# Patient Record
Sex: Female | Born: 1955
Health system: Southern US, Community
[De-identification: ages and names within clinical notes are randomized; demographics above are authoritative.]

## PROBLEM LIST (undated history)

## (undated) DIAGNOSIS — I10 Essential (primary) hypertension: Secondary | ICD-10-CM

## (undated) DIAGNOSIS — E785 Hyperlipidemia, unspecified: Secondary | ICD-10-CM

## (undated) DIAGNOSIS — K219 Gastro-esophageal reflux disease without esophagitis: Secondary | ICD-10-CM

## (undated) HISTORY — PX: ECTOPIC PREGNANCY SURGERY: SHX613

## (undated) HISTORY — DX: Gastro-esophageal reflux disease without esophagitis: K21.9

## (undated) HISTORY — PX: ABDOMINAL HYSTERECTOMY: SHX81

## (undated) HISTORY — DX: Essential (primary) hypertension: I10

## (undated) HISTORY — DX: Hyperlipidemia, unspecified: E78.5

---

## 2014-05-08 ENCOUNTER — Ambulatory Visit (INDEPENDENT_AMBULATORY_CARE_PROVIDER_SITE_OTHER): Payer: 59 | Admitting: Family Medicine

## 2014-05-08 VITALS — BP 106/76 | HR 60 | Temp 97.2°F | Resp 18 | Ht 62.0 in | Wt 146.2 lb

## 2014-05-08 DIAGNOSIS — I10 Essential (primary) hypertension: Secondary | ICD-10-CM | POA: Insufficient documentation

## 2014-05-08 DIAGNOSIS — N760 Acute vaginitis: Secondary | ICD-10-CM

## 2014-05-08 DIAGNOSIS — B9689 Other specified bacterial agents as the cause of diseases classified elsewhere: Secondary | ICD-10-CM

## 2014-05-08 DIAGNOSIS — A499 Bacterial infection, unspecified: Secondary | ICD-10-CM

## 2014-05-08 DIAGNOSIS — E785 Hyperlipidemia, unspecified: Secondary | ICD-10-CM

## 2014-05-08 DIAGNOSIS — N898 Other specified noninflammatory disorders of vagina: Secondary | ICD-10-CM

## 2014-05-08 LAB — POCT WET PREP WITH KOH
KOH PREP POC: NEGATIVE
RBC WET PREP PER HPF POC: NEGATIVE
Trichomonas, UA: NEGATIVE
YEAST WET PREP PER HPF POC: NEGATIVE

## 2014-05-08 MED ORDER — METRONIDAZOLE 500 MG PO TABS: 500.0000 mg | ORAL_TABLET | Freq: Two times a day (BID) | ORAL | Status: DC

## 2014-05-08 NOTE — Progress Notes (Signed)
[facility] [address], [place] [address] [phone]  Date:  05/08/2014   Name:  Shannon AustriaWillie Ballinger   DOB:  08/13/1955   MRN:  [id number]  PCP:  No primary provider on file.    Chief Complaint: Vaginal Discharge   History of Present Illness:  Shannon Calderon is a 58 y.o. very pleasant female patient who presents with the following:  She is here today as a new pt, has noted vaginal discharge and odor for a couple of days. She has [place] noted some itching, and has been scratching.  She does not have any pain.   Otherwise she is feeling ok.   She is generally in good health.   She is s/p hysterectomy.   She has not noted any dysuria or urinary frequency.    There are no active problems to display for this patient.   History reviewed. No pertinent past medical history.  History reviewed. No pertinent past surgical history.  History  Substance Use Topics  . Smoking status: Never Smoker   . Smokeless tobacco: Not on file  . Alcohol Use: Not on file    Family History  Problem Relation Age of Onset  . Stroke Mother   . Hypertension Mother   . Cancer Father     No Known Allergies  Medication list has been reviewed and updated.  No current outpatient prescriptions on file prior to visit.   No current facility-administered medications on file prior to visit.    Review of Systems:  As per HPI- otherwise negative.   Physical Examination: Filed Vitals:   05/08/14 1001  BP: 106/76  Pulse: 60  Temp: 97.2 F (36.2 C)  Resp: 18   Filed Vitals:   05/08/14 1001  Height: 5\' 2"  (1.575 m)  Weight: 146 lb 3.2 oz (66.316 kg)   Body mass index is 26.73 kg/(m^2). Ideal Body Weight: Weight in (lb) to have BMI = 25: 136.4  GEN: WDWN, NAD, Non-toxic, A & O x 3, looks well HEENT: Atraumatic, Normocephalic. Neck supple. No masses, No LAD. Ears and Nose: No external deformity. CV: RRR, No M/G/R. No JVD. No thrill. No extra heart sounds. PULM: CTA B,  no wheezes, crackles, rhonchi. No retractions. No resp. distress. No accessory muscle use. ABD: S, NT, ND EXTR: No c/c/e NEURO Normal gait.  PSYCH: Normally interactive. Conversant. Not depressed or anxious appearing.  Calm demeanor.  GU: no vaginal discharge or odor noted, no lesions or redness.  Cervix and uterus absent. No adnexal masses or tenderness  Results for orders placed in visit on 05/08/14  POCT WET PREP WITH KOH      Result Value Ref Range   Trichomonas, UA Negative     Clue Cells Wet Prep HPF POC 10-20     Epithelial Wet Prep HPF POC 10-20     Yeast Wet Prep HPF POC Neg     Bacteria Wet Prep HPF POC 3+     RBC Wet Prep HPF POC Neg     WBC Wet Prep HPF POC 10-20     KOH Prep POC Negative      Assessment and Plan: Bacterial vaginosis - Plan: metroNIDAZOLE (FLAGYL) 500 MG tablet  Vaginal discharge - Plan: POCT Wet Prep with KOH, GC/Chlamydia Probe Amp  BV- treat with flagyl twice a day for one week.  Await genprobe.  She would like to come and see us for a CPE soon which is great.    Signed  Lamar Blinks, MD

## 2014-05-08 NOTE — Patient Instructions (Signed)
We are going to treat you for bacterial vaginosis with the flagyl twice a day for one week. I will be in touch with the rest of your labs. We would be glad to see you for a physical exam at your convenience.

## 2014-05-09 ENCOUNTER — Encounter: Payer: Self-pay | Admitting: Family Medicine

## 2014-05-09 LAB — GC/CHLAMYDIA PROBE AMP
CT PROBE, AMP APTIMA: NEGATIVE
GC Probe RNA: NEGATIVE

## 2014-05-28 ENCOUNTER — Ambulatory Visit (INDEPENDENT_AMBULATORY_CARE_PROVIDER_SITE_OTHER): Payer: 59 | Admitting: Internal Medicine

## 2014-05-28 VITALS — BP 120/80 | HR 71 | Temp 98.2°F | Resp 16 | Ht 62.25 in | Wt 149.0 lb

## 2014-05-28 DIAGNOSIS — B9689 Other specified bacterial agents as the cause of diseases classified elsewhere: Secondary | ICD-10-CM

## 2014-05-28 DIAGNOSIS — N760 Acute vaginitis: Secondary | ICD-10-CM

## 2014-05-28 DIAGNOSIS — T23121A Burn of first degree of single right finger (nail) except thumb, initial encounter: Secondary | ICD-10-CM

## 2014-05-28 DIAGNOSIS — A499 Bacterial infection, unspecified: Secondary | ICD-10-CM

## 2014-05-28 DIAGNOSIS — Z113 Encounter for screening for infections with a predominantly sexual mode of transmission: Secondary | ICD-10-CM

## 2014-05-28 DIAGNOSIS — N898 Other specified noninflammatory disorders of vagina: Secondary | ICD-10-CM

## 2014-05-28 DIAGNOSIS — N9489 Other specified conditions associated with female genital organs and menstrual cycle: Secondary | ICD-10-CM

## 2014-05-28 DIAGNOSIS — R2231 Localized swelling, mass and lump, right upper limb: Secondary | ICD-10-CM

## 2014-05-28 LAB — POCT WET PREP WITH KOH
Clue Cells Wet Prep HPF POC: 75
KOH Prep POC: NEGATIVE
RBC WET PREP PER HPF POC: NEGATIVE
Trichomonas, UA: NEGATIVE
Yeast Wet Prep HPF POC: NEGATIVE

## 2014-05-28 LAB — HEPATITIS C ANTIBODY: HCV AB: NEGATIVE

## 2014-05-28 LAB — RPR

## 2014-05-28 LAB — HIV ANTIBODY (ROUTINE TESTING W REFLEX): HIV 1&2 Ab, 4th Generation: NONREACTIVE

## 2014-05-28 MED ORDER — METRONIDAZOLE 0.75 % EX GEL: 1.0000 "application " | Freq: Every day | CUTANEOUS | Status: AC

## 2014-05-28 NOTE — Progress Notes (Signed)
Subjective:    Patient ID: Shannon Calderon, female    DOB: 11/07/1955, 58 y.o.   MRN: [id number]   HPI  Shannon Calderon is a 58 y.o. very pleasant female patient who presents with vaginal odor.  She was here 2 weeks ago for vaginal odor and vaginal itching.  She was diagnosed with BV and given metronidazole for 7 days.  She states that the itching resolved at the end of the final day of the course of medication, but the odor continues to be present.  She has also noticed that the vaginal discharge is beginning to increase.  She was compliant with the medication.  She has not been on any recent antibiotics or has any known immunocompromise.  She is sexually active with one partner, unprotected.  Last intercourse was 1 week ago.  She does not know the STD hx of partner.  She denies fever, abdominal pain, abnormal urinary symptoms, or change in bowel habits.  She does state that she recently began using dryer sheets with her clothes.  She uses perfumed soaps lately as well, which was a change for her.  She denies douching.    She also complaints of a right finger wound a few days ago, after pulling hot food out of the oven with a towel.  She has treated this with neosporin.  She denies erythema, fever, warmth to the area, or pain at this finger.    She is also concerned of a mass at her right axilla.  She stated that this became present to her in April.  It was painful at first, but now she does not feel anything.  She denies illness, fever, fatigue, night sweats, coughing.  Her last mammogram was in June where she reports this as normal.  [place], though she is unsure as to the location of the imaging.    There are no active problems to display for this patient.  Review of Systems  Constitutional: Negative for diaphoresis and fatigue.  Respiratory: Negative for cough.   Gastrointestinal: Negative for nausea and blood in stool.  Genitourinary: Positive for vaginal discharge. Negative  for dysuria, urgency, flank pain, vaginal bleeding, vaginal pain and pelvic pain.  Skin: Positive for wound (Right finger burn.).  Allergic/Immunologic: Negative for immunocompromised state.  Neurological: Negative for weakness.  Psychiatric/Behavioral: Negative for sleep disturbance.       Objective:   Physical Exam  Constitutional: She is oriented to person, place, and time. She appears well-developed and well-nourished.  BP 120/80 mmHg  Pulse 71  Temp(Src) 98.2 F (36.8 C) (Oral)  Resp 16  Ht 5' 2.25" (1.581 m)  Wt 149 lb (67.586 kg)  BMI 27.04 kg/m2  SpO2 100%   HENT:  Nose: Nose normal.  Pulmonary/Chest: Effort normal and breath sounds normal. No respiratory distress.  Genitourinary: Vaginal discharge (Copious white discharge.  Not frothy.) found.  Lymphadenopathy:    She has axillary adenopathy.       Right axillary: Lateral (Mobile small mass with deep palpation. ) adenopathy present.       Right: No supraclavicular adenopathy present.       Left: No supraclavicular adenopathy present.  Neurological: She is alert and oriented to person, place, and time.  Skin: Skin is warm and dry.  Right finger has blistering without erythema or lack of ROM.  Superficial skin.    Psychiatric: She has a normal mood and affect. Her behavior is normal. Judgment and thought content normal.   Results  for orders placed or performed in visit on 05/28/14  POCT Wet Prep with KOH  Result Value Ref Range   Trichomonas, UA Negative    Clue Cells Wet Prep HPF POC 75%    Epithelial Wet Prep HPF POC 4-20    Yeast Wet Prep HPF POC neg    Bacteria Wet Prep HPF POC 2+    RBC Wet Prep HPF POC neg    WBC Wet Prep HPF POC 4-7    KOH Prep POC Negative          Assessment & Plan:  Bacterial vaginosis She continues to have this bacterial vaginosis.  It appears as though it was treatment failure, and not that it resolved and returned.  I am going to treat her again with metronidazole, but vaginal  gel form for hopeful resolve.  I will follow up with her regarding the results to her score.   -We also discussed perfumed and other agents that may cause this bv and recommended a probiotic supplement as well.   metroNIDAZOLE (METROGEL) 0.75 % gel for 7 days qhs (sleep)  Screening for STD (sexually transmitted disease) - Plan: RPR, Hepatitis C antibody, HSV(herpes simplex vrs) 1+2 ab-IgG, HIV antibody  Axillary mass, right  The mass appreciated with deep palpation to right axilla.  Possible lymphadenitis which should have resolved by this time.  I am referring for imaging to ensure this is not something more concerning. - Plan: CT Chest W Contrast--focus R axilla  Superficial Burn -Blistered and not infected.  Stable, can continue the neosporin.    Trena PlattStephanie English, PA-C [facility] 11/3/20154:23 PM    Trena PlattStephanie English, PA-C [facility] 11/3/201511:08 AM  I have participated in the care of this patient with the Advanced Practice Provider and agree with Diagnosis and Plan as documented. This R ax mass is tender,not red, no pore,no discrete edges, sl mobile, near chest wall, moderately firm Robert P. Merla Richesoolittle, M.D.

## 2014-05-28 NOTE — Patient Instructions (Signed)
Use a sanitary napkin.  If symptoms continue, return to clinic.

## 2014-05-29 LAB — HSV(HERPES SIMPLEX VRS) I + II AB-IGG
HSV 1 Glycoprotein G Ab, IgG: 5.06 IV — ABNORMAL HIGH
HSV 2 Glycoprotein G Ab, IgG: 4.88 IV — ABNORMAL HIGH

## 2014-06-05 ENCOUNTER — Other Ambulatory Visit: Payer: 59

## 2014-06-07 ENCOUNTER — Ambulatory Visit (INDEPENDENT_AMBULATORY_CARE_PROVIDER_SITE_OTHER): Payer: 59 | Admitting: Physician Assistant

## 2014-06-07 VITALS — BP 120/88 | HR 64 | Temp 98.2°F | Resp 16 | Ht 63.5 in | Wt 148.4 lb

## 2014-06-07 DIAGNOSIS — N898 Other specified noninflammatory disorders of vagina: Secondary | ICD-10-CM

## 2014-06-07 DIAGNOSIS — A499 Bacterial infection, unspecified: Secondary | ICD-10-CM

## 2014-06-07 DIAGNOSIS — N760 Acute vaginitis: Secondary | ICD-10-CM

## 2014-06-07 DIAGNOSIS — B9689 Other specified bacterial agents as the cause of diseases classified elsewhere: Secondary | ICD-10-CM

## 2014-06-07 LAB — POCT WET PREP WITH KOH
KOH Prep POC: NEGATIVE
RBC WET PREP PER HPF POC: NEGATIVE
Trichomonas, UA: NEGATIVE
Yeast Wet Prep HPF POC: NEGATIVE

## 2014-06-07 MED ORDER — METRONIDAZOLE 500 MG PO TABS: 500.0000 mg | ORAL_TABLET | Freq: Two times a day (BID) | ORAL | Status: DC

## 2014-06-07 MED ORDER — FLUCONAZOLE 150 MG PO TABS
150.0000 mg | ORAL_TABLET | Freq: Once | ORAL | Status: DC
Start: 2014-06-07 — End: 2014-07-04

## 2014-06-07 NOTE — Patient Instructions (Signed)
1. Due to prolonged symptoms, should pick up probiotic Align.

## 2014-06-07 NOTE — Progress Notes (Signed)
   Subjective:    Patient ID: Shannon Calderon, female    DOB: 01/27/1956, 58 y.o.   MRN: [id number]  HPI Patient presents for 2 days of vaginal discharge after finishing 7 days course of flagyl po and 5 days of vaginal flagyl for BV. Discharge is white and clumpy and itches. No blood or odor at this time. No sexual activity at this time and had partial hysterectomy with cervix removal in 2005. No urinary sx, fever, or pain, but endorses abdominal fullness. Recently started using Vagisil body wash when previous BV did not clear.  Review of Systems  Constitutional: Negative for fever, chills and fatigue.  Gastrointestinal: Negative for nausea, vomiting, abdominal pain, diarrhea and constipation.  Genitourinary: Negative for dysuria, urgency, frequency, hematuria, flank pain, decreased urine volume, vaginal bleeding, vaginal discharge, difficulty urinating, vaginal pain and pelvic pain.  Musculoskeletal: Negative for back pain.       Objective:   Physical Exam  Constitutional: She is oriented to person, place, and time. She appears well-developed and well-nourished. No distress.  Blood pressure 120/88, pulse 64, temperature 98.2 F (36.8 C), temperature source Oral, resp. rate 16, height 5' 3.5" (1.613 m), weight 148 lb 6.4 oz (67.314 kg), SpO2 98 %.  HENT:  Head: Normocephalic and atraumatic.  Right Ear: External ear normal.  Left Ear: External ear normal.  Cardiovascular: Normal rate, regular rhythm and normal heart sounds.  Exam reveals no gallop and no friction rub.   No murmur heard. Pulmonary/Chest: Effort normal and breath sounds normal.  Abdominal: Soft. Bowel sounds are normal. She exhibits no mass. There is no tenderness.  Neurological: She is alert and oriented to person, place, and time.  Skin: Skin is warm and dry. No rash noted. She is not diaphoretic. No erythema. No pallor.    Results for orders placed or performed in visit on 06/07/14  POCT Wet Prep with KOH  Result Value  Ref Range   Trichomonas, UA Negative    Clue Cells Wet Prep HPF POC 2-5    Epithelial Wet Prep HPF POC 4-10    Yeast Wet Prep HPF POC neg    Bacteria Wet Prep HPF POC 3+    RBC Wet Prep HPF POC neg    WBC Wet Prep HPF POC 1-4    KOH Prep POC Negative        Assessment & Plan:  1. Vaginal discharge - POCT Wet Prep with KOH - fluconazole (DIFLUCAN) 150 MG tablet; Take 1 tablet (150 mg total) by mouth once. Repeat if needed  Dispense: 2 tablet; Refill: 0 - metroNIDAZOLE (FLAGYL) 500 MG tablet; Take 1 tablet (500 mg total) by mouth 2 (two) times daily with a meal. DO NOT CONSUME ALCOHOL WHILE TAKING THIS MEDICATION.  Dispense: 14 tablet; Refill: 0  2. Bacterial vaginosis Advised to discontinue use of vaginal. Can use dove or ivory as previous, but does not need to scrub on the inside. Will only further irritate vagina. Can use Align probiotic to help re-balance flora. - fluconazole (DIFLUCAN) 150 MG tablet; Take 1 tablet (150 mg total) by mouth once. Repeat if needed  Dispense: 2 tablet; Refill: 0 - metroNIDAZOLE (FLAGYL) 500 MG tablet; Take 1 tablet (500 mg total) by mouth 2 (two) times daily with a meal. DO NOT CONSUME ALCOHOL WHILE TAKING THIS MEDICATION.  Dispense: 14 tablet; Refill: 0   Augustus Zurawski PA-C  [facility] 06/07/2014 3:04 PM

## 2014-06-07 NOTE — Progress Notes (Signed)
I have discussed this case with Ms. Brewington, PA-C and agree.  

## 2014-06-14 ENCOUNTER — Telehealth: Payer: Self-pay | Admitting: Physician Assistant

## 2014-06-18 ENCOUNTER — Ambulatory Visit (INDEPENDENT_AMBULATORY_CARE_PROVIDER_SITE_OTHER): Payer: 59 | Admitting: Family Medicine

## 2014-06-18 VITALS — BP 105/80 | HR 62 | Temp 98.3°F | Resp 18 | Wt 151.0 lb

## 2014-06-18 DIAGNOSIS — R2231 Localized swelling, mass and lump, right upper limb: Secondary | ICD-10-CM

## 2014-06-18 DIAGNOSIS — N9489 Other specified conditions associated with female genital organs and menstrual cycle: Secondary | ICD-10-CM

## 2014-06-18 DIAGNOSIS — N898 Other specified noninflammatory disorders of vagina: Secondary | ICD-10-CM

## 2014-06-18 LAB — POCT WET PREP WITH KOH
Clue Cells Wet Prep HPF POC: NEGATIVE
KOH PREP POC: NEGATIVE
Trichomonas, UA: NEGATIVE
Yeast Wet Prep HPF POC: NEGATIVE

## 2014-06-18 NOTE — Progress Notes (Signed)
[facility] [address], [place] [address] [phone]  Date:  06/18/2014   Name:  Shannon Calderon   DOB:  09/17/1955   MRN:  [id number]  PCP:  No PCP Per Patient    Chief Complaint: Follow-up and Vaginitis   History of Present Illness:  Shannon Calderon is a 58 y.o. very pleasant female patient who presents with the following:  Here today to follow-up a vaginitis issue.   Here on 10/14 and treated with flagyl for BV.  She had a negative genprobe at that visit.   Came back on 11/3 with continued vaginal odor. She was noted to have persistent BV and was again treated with metronidazole gel this time.  Again seen on 11/13 and treated with diflucan and flagyl again.    Negative HIV, RPR, Hep C at her 11/3 visit.  She is aware of positive HSV antibody  She is s/p hysterectomy She states that "I still don't feel refreshed."  She continues to note an odor but no itching.  There is no discharge.   She otherwise feels good.  No abdominal pain, etc.    She had an appt to have a CT of her chest as she was noted to have a "lump under my arm." However she [facility] as they are out of her network. Her most recent mammogram was last year.  It looked ok per her report.  She would like to repeat this year.   [place]  Patient Active Problem List   Diagnosis Date Noted  . HTN (hypertension) 05/08/2014  . Hyperlipidemia 05/08/2014    Past Medical History  Diagnosis Date  . GERD (gastroesophageal reflux disease)   . Hyperlipidemia   . Hypertension     Past Surgical History  Procedure Laterality Date  . Abdominal hysterectomy    . Ectopic pregnancy surgery      Had 2 ectopic pregnancies    History  Substance Use Topics  . Smoking status: Never Smoker   . Smokeless tobacco: Not on file  . Alcohol Use: No    Family History  Problem Relation Age of Onset  . Stroke Mother   . Hypertension Mother    . Cancer Father     No Known Allergies  Medication list has been reviewed and updated.  Current Outpatient Prescriptions on File Prior to Visit  Medication Sig Dispense Refill  . acyclovir (ZOVIRAX) 400 MG tablet Take 400 mg by mouth once.    . hydrochlorothiazide (MICROZIDE) 12.5 MG capsule Take 12.5 mg by mouth daily.    Marland Kitchen. lovastatin (MEVACOR) 20 MG tablet Take 20 mg by mouth at bedtime.    Marland Kitchen. omeprazole (PRILOSEC) 20 MG capsule Take 20 mg by mouth daily.    . fluconazole (DIFLUCAN) 150 MG tablet Take 1 tablet (150 mg total) by mouth once. Repeat if needed (Patient not taking: Reported on 06/18/2014) 2 tablet 0  . metroNIDAZOLE (FLAGYL) 500 MG tablet Take 1 tablet (500 mg total) by mouth 2 (two) times daily with a meal. DO NOT CONSUME ALCOHOL WHILE TAKING THIS MEDICATION. (Patient not taking: Reported on 06/18/2014) 14 tablet 0   No current facility-administered medications on file prior to visit.    Review of Systems:  As per HPI- otherwise negative.   Physical Examination: Filed Vitals:   06/18/14 0805  BP: 105/80  Pulse: 62  Temp: 98.3 F (36.8 C)  Resp: 18   Filed Vitals:   06/18/14 0805  Weight: 151 lb (68.493 kg)   Body mass index is 26.33 kg/(m^2). Ideal Body Weight:    GEN: WDWN, NAD, Non-toxic, A & O x 3, looks well HEENT: Atraumatic, Normocephalic. Neck supple. No masses, No LAD. Ears and Nose: No external deformity. CV: RRR, No M/G/R. No JVD. No thrill. No extra heart sounds. PULM: CTA B, no wheezes, crackles, rhonchi. No retractions. No resp. distress. No accessory muscle use. ABD: S, NT, ND, +BS. No rebound. No HSM. EXTR: No c/c/e NEURO Normal gait.  PSYCH: Normally interactive. Conversant. Not depressed or anxious appearing.  Calm demeanor.  GU: normal exam s/p hysterectomy.  No unusual discharge. No lesion, no inflammation, no discernable odor I am not able to discern any palpable abnormality under her right arm  Results for orders placed or  performed in visit on 06/18/14  POCT Wet Prep with KOH  Result Value Ref Range   Trichomonas, UA Negative    Clue Cells Wet Prep HPF POC neg    Epithelial Wet Prep HPF POC 0-3    Yeast Wet Prep HPF POC neg    Bacteria Wet Prep HPF POC 2+    RBC Wet Prep HPF POC 0-1    WBC Wet Prep HPF POC 1-2    KOH Prep POC Negative     Assessment and Plan: Vaginal odor - Plan: POCT Wet Prep with KOH  Axillary mass, right - Plan: MM Digital Diagnostic Bilat  Reassured that there is no current evidence of BV or monilia.  Suggested that she give her self some more time to stabilize prior to using any further flagyl, etc.  She will try boric acid suppositories if she would like.   Will refer for a diagnostic mammogram to update her mammo and also address the abnormality she has noted under her arm   Signed Abbe AmsterdamJessica Copland, MD

## 2014-06-18 NOTE — Patient Instructions (Signed)
Your vaginal swab looks fine. I do not think you have an active bacterial or yeast infection currently.  If you would like, try using the boric acid suppositories twice a week.  You can find [facility] at [facility] I will refer you for a diagnostic mammogram to look at the area under your right arm

## 2014-07-04 ENCOUNTER — Ambulatory Visit (INDEPENDENT_AMBULATORY_CARE_PROVIDER_SITE_OTHER): Payer: 59 | Admitting: Obstetrics and Gynecology

## 2014-07-04 ENCOUNTER — Encounter: Payer: Self-pay | Admitting: Obstetrics and Gynecology

## 2014-07-04 VITALS — BP 112/80 | HR 69 | Ht 63.0 in | Wt 151.0 lb

## 2014-07-04 DIAGNOSIS — R1031 Right lower quadrant pain: Secondary | ICD-10-CM

## 2014-07-04 DIAGNOSIS — N760 Acute vaginitis: Secondary | ICD-10-CM

## 2014-07-04 NOTE — Progress Notes (Signed)
Patient ID: Shannon Calderon, female   DOB: 11/22/1955, 58 y.o.   MRN: [id number] 58 yo presenting today for the evaluation of vaginal pruritis. Patient reports being treated for yeast or BV for the past month and would like to make sure that the infection has cleared. She denies any vaginal discharge but reports some pruritis at bedtime. She also reports onset of RLQ pain following intercourse 6 days ago. The pain comes and goes. There are no aggravating factors. Rest and ibuprofen helps with the pain. Patient is without any other complaints.  Past Medical History  Diagnosis Date  . GERD (gastroesophageal reflux disease)   . Hyperlipidemia   . Hypertension    Past Surgical History  Procedure Laterality Date  . Abdominal hysterectomy    . Ectopic pregnancy surgery      Had 2 ectopic pregnancies   Family History  Problem Relation Age of Onset  . Stroke Mother   . Hypertension Mother   . Cancer Father    History  Substance Use Topics  . Smoking status: Never Smoker   . Smokeless tobacco: Not on file  . Alcohol Use: No   GENERAL: Well-developed, well-nourished female in no acute distress.  ABDOMEN: Soft, nontender, nondistended.  PELVIC: Normal external female genitalia. Vagina is pink and rugated.  White discharge. No adnexal mass or tenderness. EXTREMITIES: No cyanosis, clubbing, or edema, 2+ distal pulses.  A/P 58 yo with vaginitis and RLQ pain - wet prep collected - Pelvic ultrasound ordered to rule out GYN etiology of pain - Patient will be contacted with any abnormal results - I reminded the patient to follow up with PCP as planned regarding mammogram

## 2014-07-05 LAB — WET PREP, GENITAL
Clue Cells Wet Prep HPF POC: NONE SEEN
Trich, Wet Prep: NONE SEEN
WBC, Wet Prep HPF POC: NONE SEEN
Yeast Wet Prep HPF POC: NONE SEEN

## 2014-07-16 ENCOUNTER — Other Ambulatory Visit: Payer: Self-pay | Admitting: Obstetrics and Gynecology

## 2014-07-16 ENCOUNTER — Telehealth: Payer: Self-pay

## 2014-07-16 ENCOUNTER — Ambulatory Visit (HOSPITAL_COMMUNITY)
Admission: RE | Admit: 2014-07-16 | Discharge: 2014-07-16 | Disposition: A | Discharge status: Home / Self Care | Payer: 59 | Source: Ambulatory Visit | Attending: Obstetrics and Gynecology | Admitting: Obstetrics and Gynecology

## 2014-07-16 DIAGNOSIS — Z90710 Acquired absence of both cervix and uterus: Secondary | ICD-10-CM | POA: Insufficient documentation

## 2014-07-16 DIAGNOSIS — R1031 Right lower quadrant pain: Secondary | ICD-10-CM

## 2014-07-16 NOTE — Telephone Encounter (Signed)
Called patient and informed her of results and recommendations. Patient verbalized understanding and gratitude. No questions or concerns.  

## 2014-07-16 NOTE — Telephone Encounter (Signed)
-----   Message from Catalina AntiguaPeggy Constant, MD sent at 07/16/2014  2:07 PM EST ----- Please inform patient that her pelvic ultrasound is normal and there is no gyn cause for her pain. She should follow up with her primary care for further investigation  Thanks  Peggy

## 2014-07-22 ENCOUNTER — Ambulatory Visit (INDEPENDENT_AMBULATORY_CARE_PROVIDER_SITE_OTHER): Payer: 59 | Admitting: Family Medicine

## 2014-07-22 VITALS — BP 110/70 | HR 69 | Temp 98.3°F | Resp 18 | Ht 62.0 in | Wt 149.8 lb

## 2014-07-22 DIAGNOSIS — I10 Essential (primary) hypertension: Secondary | ICD-10-CM

## 2014-07-22 DIAGNOSIS — E785 Hyperlipidemia, unspecified: Secondary | ICD-10-CM

## 2014-07-22 DIAGNOSIS — B001 Herpesviral vesicular dermatitis: Secondary | ICD-10-CM

## 2014-07-22 DIAGNOSIS — B009 Herpesviral infection, unspecified: Secondary | ICD-10-CM

## 2014-07-22 DIAGNOSIS — Z1329 Encounter for screening for other suspected endocrine disorder: Secondary | ICD-10-CM

## 2014-07-22 LAB — COMPREHENSIVE METABOLIC PANEL
ALT: 21 U/L (ref 0–35)
AST: 17 U/L (ref 0–37)
Albumin: 4.3 g/dL (ref 3.5–5.2)
BUN: 8 mg/dL (ref 6–23)
CO2: 26 mEq/L (ref 19–32)
Calcium: 9.7 mg/dL (ref 8.4–10.5)
Chloride: 105 mEq/L (ref 96–112)
Potassium: 4.6 mEq/L (ref 3.5–5.3)
Total Protein: 7.2 g/dL (ref 6.0–8.3)

## 2014-07-22 LAB — LIPID PANEL
Cholesterol: 199 mg/dL (ref 0–200)
HDL: 53 mg/dL (ref >39)
LDL Cholesterol: 129 mg/dL — ABNORMAL HIGH (ref 0–99)
Total CHOL/HDL Ratio: 3.8 Ratio
Triglycerides: 87 mg/dL (ref <150)
VLDL: 17 mg/dL (ref 0–40)

## 2014-07-22 LAB — COMPREHENSIVE METABOLIC PANEL WITH GFR
Alkaline Phosphatase: 68 U/L (ref 39–117)
Creat: 0.53 mg/dL (ref 0.50–1.10)
Glucose, Bld: 96 mg/dL (ref 70–99)
Sodium: 141 meq/L (ref 135–145)
Total Bilirubin: 0.4 mg/dL (ref 0.2–1.2)

## 2014-07-22 LAB — TSH: TSH: 2.157 u[IU]/mL (ref 0.350–4.500)

## 2014-07-22 MED ORDER — OMEPRAZOLE 20 MG PO CPDR: 20.0000 mg | DELAYED_RELEASE_CAPSULE | Freq: Every day | ORAL | Status: DC

## 2014-07-22 MED ORDER — VALACYCLOVIR HCL 1 G PO TABS: ORAL_TABLET | ORAL | Status: DC

## 2014-07-22 MED ORDER — ACYCLOVIR 400 MG PO TABS: 400.0000 mg | ORAL_TABLET | Freq: Once | ORAL | Status: DC

## 2014-07-22 MED ORDER — HYDROCHLOROTHIAZIDE 12.5 MG PO CAPS: 12.5000 mg | ORAL_CAPSULE | Freq: Every day | ORAL | Status: DC

## 2014-07-22 MED ORDER — LOVASTATIN 20 MG PO TABS: 20.0000 mg | ORAL_TABLET | Freq: Every day | ORAL | Status: DC

## 2014-07-22 NOTE — Progress Notes (Signed)
Chief Complaint:  Chief Complaint  Patient presents with  . Mouth Lesions    fever blisters x 2 days    HPI: Shannon Calderon is a 58 y.o. female who is here for  2 day hx of fevers blisters but she states usually aorund mouth not on nse Same typ eof pain  But no other sxs. Sh eis already on acylovir ppx for prevention of genitia herpes  Past Medical History  Diagnosis Date  . GERD (gastroesophageal reflux disease)   . Hyperlipidemia   . Hypertension    Past Surgical History  Procedure Laterality Date  . Abdominal hysterectomy    . Ectopic pregnancy surgery      Had 2 ectopic pregnancies   History   Social History  . Marital Status: Single    Spouse Name: N/A    Number of Children: N/A  . Years of Education: N/A   Social History Main Topics  . Smoking status: Never Smoker   . Smokeless tobacco: None  . Alcohol Use: No  . Drug Use: No  . Sexual Activity: None   Other Topics Concern  . None   Social History Narrative   Family History  Problem Relation Age of Onset  . Stroke Mother   . Hypertension Mother   . Cancer Father    No Known Allergies Prior to Admission medications   Medication Sig Start Date End Date Taking? Authorizing Provider  acyclovir (ZOVIRAX) 400 MG tablet Take 1 tablet (400 mg total) by mouth once. 07/22/14  Yes Thao P Le, DO  aspirin 81 MG tablet Take 81 mg by mouth daily.   Yes Historical Provider, MD  hydrochlorothiazide (MICROZIDE) 12.5 MG capsule Take 1 capsule (12.5 mg total) by mouth daily. 07/22/14  Yes Thao P Le, DO  lovastatin (MEVACOR) 20 MG tablet Take 1 tablet (20 mg total) by mouth at bedtime. 07/22/14  Yes Thao P Le, DO  omeprazole (PRILOSEC) 20 MG capsule Take 1 capsule (20 mg total) by mouth daily. 07/22/14  Yes Thao P Le, DO  valACYclovir (VALTREX) 1000 MG tablet Take 2 tabs PO every 12 hours for 1 day, if persist then take 1 tab PO BID x 4 more days 07/22/14   Thao P Le, DO     ROS: The patient denies fevers,  chills, night sweats, unintentional weight loss, chest pain, palpitations, wheezing, dyspnea on exertion, nausea, vomiting, abdominal pain, dysuria, hematuria, melena, numbness, weakness, or tingling.  All other systems have been reviewed and were otherwise negative with the exception of those mentioned in the HPI and as above.    PHYSICAL EXAM: Filed Vitals:   07/22/14 0840  BP: 110/70  Pulse: 69  Temp: 98.3 F (36.8 C)  Resp: 18   Filed Vitals:   07/22/14 0840  Height: 5\' 2"  (1.575 m)  Weight: 149 lb 12.8 oz (67.949 kg)   Body mass index is 27.39 kg/(m^2).  General: Alert, no acute distress HEENT:  Normocephalic, atraumatic, oropharynx patent. EOMI, PERRLA Cardiovascular:  Regular rate and rhythm, no rubs murmurs or gallops.  No Carotid bruits, radial pulse intact. No pedal edema.  Respiratory: Clear to auscultation bilaterally.  No wheezes, rales, or rhonchi.  No cyanosis, no use of accessory musculature GI: No organomegaly, abdomen is soft and non-tender, positive bowel sounds.  No masses. Skin: + minimal blistersl on nasal araeea at opening eurologic: Facial musculature symmetric. Psychiatric: Patient is appropriate throughout our interaction. Lymphatic: No cervical lymphadenopathy Musculoskeletal: Gait intact.  LABS: Results for orders placed or performed in visit on 07/04/14  Wet prep, genital  Result Value Ref Range   Yeast Wet Prep HPF POC NONE SEEN NONE SEEN   Trich, Wet Prep NONE SEEN NONE SEEN   Clue Cells Wet Prep HPF POC NONE SEEN NONE SEEN   WBC, Wet Prep HPF POC NONE SEEN NONE SEEN     EKG/XRAY:   [facility]   ASSESSMENT/PLAN: Encounter Diagnoses  Name Primary?  . HSV (herpes simplex virus) infection Yes  . Recurrent cold sores   . Hyperlipidemia   . Essential hypertension   . Screening for thyroid disorder      Gross sideeffects, risk and benefits, and alternatives of medications d/w patient. Patient is aware  that all medications have potential sideeffects and we are unable to predict every sideeffect or drug-drug interaction that may occur.  LE, THAO PHUONG, DO 07/22/2014 10:00 AM

## 2014-08-01 NOTE — Telephone Encounter (Signed)
LM on answering machine, to call back  (wanted to talk about hsv resultsand follow up on bv)

## 2014-08-28 ENCOUNTER — Encounter: Payer: Self-pay | Admitting: Family Medicine

## 2014-08-31 ENCOUNTER — Emergency Department (HOSPITAL_COMMUNITY)
Admission: EM | Admit: 2014-08-31 | Discharge: 2014-08-31 | Discharge status: Absent without leave | Payer: 59 | Source: Home / Self Care

## 2014-11-01 ENCOUNTER — Encounter: Payer: 59 | Admitting: Family Medicine

## 2014-11-28 ENCOUNTER — Encounter: Payer: Self-pay | Admitting: Family Medicine

## 2014-11-28 ENCOUNTER — Ambulatory Visit (INDEPENDENT_AMBULATORY_CARE_PROVIDER_SITE_OTHER): Payer: 59

## 2014-11-28 ENCOUNTER — Ambulatory Visit (INDEPENDENT_AMBULATORY_CARE_PROVIDER_SITE_OTHER): Payer: 59 | Admitting: Family Medicine

## 2014-11-28 VITALS — BP 112/66 | HR 63 | Temp 98.0°F | Resp 16 | Ht 62.75 in | Wt 146.4 lb

## 2014-11-28 DIAGNOSIS — E785 Hyperlipidemia, unspecified: Secondary | ICD-10-CM

## 2014-11-28 DIAGNOSIS — A6000 Herpesviral infection of urogenital system, unspecified: Secondary | ICD-10-CM

## 2014-11-28 DIAGNOSIS — B001 Herpesviral vesicular dermatitis: Secondary | ICD-10-CM

## 2014-11-28 DIAGNOSIS — I10 Essential (primary) hypertension: Secondary | ICD-10-CM

## 2014-11-28 DIAGNOSIS — M5442 Lumbago with sciatica, left side: Secondary | ICD-10-CM

## 2014-11-28 DIAGNOSIS — Z760 Encounter for issue of repeat prescription: Secondary | ICD-10-CM

## 2014-11-28 MED ORDER — MELOXICAM 15 MG PO TABS: 15.0000 mg | ORAL_TABLET | Freq: Every day | ORAL | Status: DC

## 2014-11-28 MED ORDER — ACYCLOVIR 400 MG PO TABS: 400.0000 mg | ORAL_TABLET | Freq: Once | ORAL | Status: AC

## 2014-11-28 MED ORDER — HYDROCHLOROTHIAZIDE 12.5 MG PO CAPS: 12.5000 mg | ORAL_CAPSULE | Freq: Every day | ORAL | Status: DC

## 2014-11-28 MED ORDER — TRAMADOL HCL 50 MG PO TABS: 50.0000 mg | ORAL_TABLET | Freq: Every evening | ORAL | Status: DC | PRN

## 2014-11-28 MED ORDER — OMEPRAZOLE 20 MG PO CPDR: DELAYED_RELEASE_CAPSULE | ORAL | Status: AC

## 2014-11-28 MED ORDER — LOVASTATIN 20 MG PO TABS: 20.0000 mg | ORAL_TABLET | Freq: Every day | ORAL | Status: AC

## 2014-11-28 NOTE — Patient Instructions (Addendum)
"Eat Fat, Get Thin"- Dr. Bary LericheMark Hymen has written this book as a guide to better nutrition and weight loss.   Sciatica Sciatica is pain, weakness, numbness, or tingling along your sciatic nerve. The nerve starts in the lower back and runs down the back of each leg. Nerve damage or certain conditions pinch or put pressure on the sciatic nerve. This causes the pain, weakness, and other discomforts of sciatica. HOME CARE   Only take medicine as told by your doctor.  Apply ice to the affected area for 20 minutes. Do this 3-4 times a day for the first 48-72 hours. Then try heat in the same way.  Exercise, stretch, or do your usual activities if these do not make your pain worse.  Go to physical therapy as told by your doctor.  Keep all doctor visits as told.  Do not wear high heels or shoes that are not supportive.  Get a firm mattress if your mattress is too soft to lessen pain and discomfort. GET HELP RIGHT AWAY IF:   You cannot control when you poop (bowel movement) or pee (urinate).  You have more weakness in your lower back, lower belly (pelvis), butt (buttocks), or legs.  You have redness or puffiness (swelling) of your back.  You have a burning feeling when you pee.  You have pain that gets worse when you lie down.  You have pain that wakes you from your sleep.  Your pain is worse than past pain.  Your pain lasts longer than 4 weeks.  You are suddenly losing weight without reason. MAKE SURE YOU:   Understand these instructions.  Will watch this condition.  Will get help right away if you are not doing well or get worse. Document Released: 04/20/2008 Document Revised: 01/11/2012 Document Reviewed: 11/21/2011 [facility] Patient Information 2015 [place], [place]. This information is not intended to replace advice given to you by your health care provider. Make sure you discuss any questions you have with your health care provider.    Back Exercises Back exercises help  treat and prevent back injuries. The goal is to increase your strength in your belly (abdominal) and back muscles. These exercises can also help with flexibility. Start these exercises when told by your doctor. HOME CARE Back exercises include: Pelvic Tilt.  Lie on your back with your knees bent. Tilt your pelvis until the lower part of your back is against the floor. Hold this position 5 to 10 sec. Repeat this exercise 5 to 10 times. Knee to Chest.  Pull 1 knee up against your chest and hold for 20 to 30 seconds. Repeat this with the other knee. This may be done with the other leg straight or bent, whichever feels better. Then, pull both knees up against your chest. Sit-Ups or Curl-Ups.  Bend your knees 90 degrees. Start with tilting your pelvis, and do a partial, slow sit-up. Only lift your upper half 30 to 45 degrees off the floor. Take at least 2 to 3 seonds for each sit-up. Do not do sit-ups with your knees out straight. If partial sit-ups are difficult, simply do the above but with only tightening your belly (abdominal) muscles and holding it as told. Hip-Lift.  Lie on your back with your knees flexed 90 degrees. Push down with your feet and shoulders as you raise your hips 2 inches off the floor. Hold for 10 seconds, repeat 5 to 10 times. Back Arches.  Lie on your stomach. Prop yourself up on bent elbows.  Slowly press on your hands, causing an arch in your low back. Repeat 3 to 5 times. Shoulder-Lifts.  Lie face down with arms beside your body. Keep hips and belly pressed to floor as you slowly lift your head and shoulders off the floor. Do not overdo your exercises. Be careful in the beginning. Exercises may cause you some mild back discomfort. If the pain lasts for more than 15 minutes, stop the exercises until you see your doctor. Improvement with exercise for back problems is slow.  Document Released: 08/14/2010 Document Revised: 10/04/2011 Document Reviewed: 05/13/2011 [facility]Exit[facility]  Patient Information 2015 [place], [place]. This information is not intended to replace advice given to you by your health care provider. Make sure you discuss any questions you have with your health care provider.

## 2014-11-28 NOTE — Progress Notes (Signed)
Subjective:    Patient ID: Shannon Calderon, female    DOB: 1956-06-16, 59 y.o.   MRN: [id number]  HPI This 59 y.o female has HTN, lipid disorder and HSV infections. Prescribed medications for these conditions are well tolerated w/o adverse effects. Pt occasionally monitors BP and reports stable readings. She denies diaphoresis, fatigue, vision disturbances, CP or tightness, palpitations, edema, SOB or DOE, cough, HA, dizziness, weakness, numbness or syncope.  Today, pt c/o L leg pain radiating from hip to knee. There is a dull ache in the L hip but no weakness or numbness. 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 Wearing a back brace relieves pain. Pt has not tried any OTC medications. The pain awakens pt sometimes.  Patient Active Problem List   Diagnosis Date Noted  . HTN (hypertension) 05/08/2014  . Hyperlipidemia 05/08/2014    Prior to Admission medications   Medication Sig Start Date End Date Taking? Authorizing Provider  acyclovir (ZOVIRAX) 400 MG tablet Take 1 tablet (400 mg total) by mouth once.   Yes   aspirin 81 MG tablet Take 81 mg by mouth daily.   Yes Historical Provider, MD  hydrochlorothiazide (MICROZIDE) 12.5 MG capsule Take 1 capsule (12.5 mg total) by mouth daily.   Yes   lovastatin (MEVACOR) 20 MG tablet Take 1 tablet (20 mg total) by mouth at bedtime.   Yes   omeprazole (PRILOSEC) 20 MG capsule Take 2 capsules by mouth daily.   Yes   meloxicam (MOBIC) 15 MG tablet Take 1 tablet (15 mg total) by mouth daily. Take with food or snack.      traMADol (ULTRAM) 50 MG tablet Take 1 tablet (50 mg total) by mouth at bedtime as needed.      valACYclovir (VALTREX) 1000 MG tablet Take 2 tabs PO every 12 hours for 1 day, if persist then take 1 tab PO BID x 4 more days Patient not taking: Reported on 11/28/2014 07/22/14   Lenell Antu, DO     Past Surgical History  Procedure Laterality Date  . Abdominal hysterectomy    . Ectopic pregnancy surgery       Had 2 ectopic pregnancies    History   Social History  . Marital Status: Single    Spouse Name: N/A  . Number of Children: N/A  . Years of Education: N/A   Occupational History  . Works in a Set designer   Social History Main Topics  . Smoking status: Never Smoker   . Smokeless tobacco: Not on file  . Alcohol Use: No  . Drug Use: No  . Sexual Activity: Not on file   Other Topics Concern  . Not on file   Social History Narrative    Family History  Problem Relation Age of Onset  . Stroke Mother   . Hypertension Mother   . Cancer Father     Review of Systems  Constitutional: Negative.   Respiratory: Negative.   Cardiovascular: Negative.   Gastrointestinal: Negative.   Musculoskeletal: Positive for arthralgias. Negative for myalgias, back pain, joint swelling and gait problem.  Skin: Negative.   Neurological: Negative.        Objective:   Physical Exam  Constitutional: She is oriented to person, place, and time. Vital signs are normal. She appears well-developed and well-nourished. No distress.  Blood pressure 112/66, pulse 63, temperature 98 F (36.7 C), temperature source Oral, resp. rate 16, height 5' 2.75" (1.594 m), weight  146 lb 6.4 oz (66.407 kg), SpO2 98 %.   HENT:  Head: Normocephalic and atraumatic.  Left Ear: External ear normal.  Nose: Nose normal.  Mouth/Throat: Oropharynx is clear and moist.  Eyes: Conjunctivae are normal. No scleral icterus.  Neck: Normal range of motion. Neck supple.  Cardiovascular: Normal rate, regular rhythm and normal heart sounds.   Pulmonary/Chest: Effort normal and breath sounds normal. No respiratory distress.  Musculoskeletal:       Right hip: Normal.       Left hip: She exhibits decreased strength and tenderness. She exhibits no swelling and no deformity.       Cervical back: Normal.       Thoracic back: Normal.       Lumbar back: She exhibits tenderness and spasm. She exhibits no bony tenderness,  no swelling, no deformity and no pain.  SLR- + on L. Pt cannot stand on L leg and bear all weight. Flexion and extension is normal. Increased discomfort w/ lateral bending to L.  Neurological: She is alert and oriented to person, place, and time. No cranial nerve deficit. She exhibits normal muscle tone. Coordination normal.  Skin: Skin is warm and dry. She is not diaphoretic.  Psychiatric: She has a normal mood and affect. Her behavior is normal. Judgment and thought content normal.  Nursing note and vitals reviewed.   [facility] reading (PRIMARY) by  Dr. Audria NineMcPherson: LS spine xray- Normal lumbar spine w/ good disc spacing. No abnormal bony lesions or fractures.     Assessment & Plan:  Essential hypertension - Stable and well controlled on current medications. Plan: hydrochlorothiazide (MICROZIDE) 12.5 MG capsule, omeprazole (PRILOSEC) 20 MG capsule, lovastatin (MEVACOR) 20 MG tablet, acyclovir (ZOVIRAX) 400 MG tablet  Midline low back pain with left-sided sciatica - Print information given re: sciatica and back exercises. Pt will resume wearing back brace at work. Trial Meloxicam 15 mg 1 tab daily w/ food. RX: Tramadol 50 mg 1 tablet hs for pain. Moist heat and other symptomatic treatment. 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  Recurrent cold sores - Plan: hydrochlorothiazide (MICROZIDE) 12.5 MG capsule, omeprazole (PRILOSEC) 20 MG capsule, lovastatin (MEVACOR) 20 MG tablet, acyclovir (ZOVIRAX) 400 MG tablet  Hyperlipidemia - Plan: hydrochlorothiazide (MICROZIDE) 12.5 MG capsule, omeprazole (PRILOSEC) 20 MG capsule, lovastatin (MEVACOR) 20 MG tablet, acyclovir (ZOVIRAX) 400 MG tablet  Medication refills.  Meds ordered this encounter  Medications  . hydrochlorothiazide (MICROZIDE) 12.5 MG capsule    Sig: Take 1 capsule (12.5 mg total) by mouth daily.    Dispense:  90 capsule    Refill:  1  . omeprazole  (PRILOSEC) 20 MG capsule    Sig: Take 2 capsules by mouth daily.    Dispense:  180 capsule    Refill:  1  . lovastatin (MEVACOR) 20 MG tablet    Sig: Take 1 tablet (20 mg total) by mouth at bedtime.    Dispense:  90 tablet    Refill:  1  . acyclovir (ZOVIRAX) 400 MG tablet    Sig: Take 1 tablet (400 mg total) by mouth once.    Dispense:  90 tablet    Refill:  1  . meloxicam (MOBIC) 15 MG tablet    Sig: Take 1 tablet (15 mg total) by mouth daily. Take with food or snack.    Dispense:  30 tablet    Refill:  2  . traMADol (ULTRAM) 50 MG  tablet    Sig: Take 1 tablet (50 mg total) by mouth at bedtime as needed.    Dispense:  30 tablet    Refill:  0

## 2014-12-03 ENCOUNTER — Telehealth: Payer: Self-pay | Admitting: Family Medicine

## 2014-12-03 NOTE — Telephone Encounter (Signed)
No answer voice mail box isnt set up to leave messages

## 2015-01-03 ENCOUNTER — Encounter: Payer: 59 | Admitting: Family Medicine

## 2015-01-13 ENCOUNTER — Other Ambulatory Visit: Payer: Self-pay | Admitting: Family Medicine

## 2015-01-13 DIAGNOSIS — M5442 Lumbago with sciatica, left side: Secondary | ICD-10-CM

## 2015-01-13 NOTE — Telephone Encounter (Signed)
Rx faxed

## 2015-01-13 NOTE — Telephone Encounter (Signed)
Dr. Audria Nine prescribed tramadol for new problem 1 month ago. Refilled #30 pills. If she needs more after this, she will need to return for OV.

## 2015-01-20 ENCOUNTER — Encounter: Payer: Self-pay | Admitting: Family Medicine

## 2015-01-20 ENCOUNTER — Ambulatory Visit (INDEPENDENT_AMBULATORY_CARE_PROVIDER_SITE_OTHER): Payer: 59 | Admitting: Family Medicine

## 2015-01-20 VITALS — BP 102/69 | HR 57 | Temp 98.0°F | Resp 16 | Ht 64.0 in | Wt 142.0 lb

## 2015-01-20 DIAGNOSIS — Z131 Encounter for screening for diabetes mellitus: Secondary | ICD-10-CM | POA: Diagnosis not present

## 2015-01-20 DIAGNOSIS — E785 Hyperlipidemia, unspecified: Secondary | ICD-10-CM

## 2015-01-20 DIAGNOSIS — Z0000 Encounter for general adult medical examination without abnormal findings: Secondary | ICD-10-CM

## 2015-01-20 DIAGNOSIS — Z130 Encounter for screening for diseases of the blood and blood-forming organs and certain disorders involving the immune mechanism: Secondary | ICD-10-CM

## 2015-01-20 LAB — CBC
HCT: 39.7 % (ref 36.0–46.0)
HEMOGLOBIN: 12.8 g/dL (ref 12.0–15.0)
MCH: 27.8 pg (ref 26.0–34.0)
MCHC: 32.2 g/dL (ref 30.0–36.0)
MCV: 86.3 fL (ref 78.0–100.0)
MPV: 10.3 fL (ref 8.6–12.4)
Platelets: 249 10*3/uL (ref 150–400)
RBC: 4.6 MIL/uL (ref 3.87–5.11)
RDW: 13.8 % (ref 11.5–15.5)
WBC: 4.9 10*3/uL (ref 4.0–10.5)

## 2015-01-20 LAB — COMPREHENSIVE METABOLIC PANEL
ALBUMIN: 4.3 g/dL (ref 3.5–5.2)
ALK PHOS: 61 U/L (ref 39–117)
ALT: 16 U/L (ref 0–35)
AST: 17 U/L (ref 0–37)
BUN: 14 mg/dL (ref 6–23)
CO2: 23 mEq/L (ref 19–32)
Calcium: 9.7 mg/dL (ref 8.4–10.5)
Chloride: 105 mEq/L (ref 96–112)
Creat: 0.6 mg/dL (ref 0.50–1.10)
GLUCOSE: 85 mg/dL (ref 70–99)
POTASSIUM: 4.6 meq/L (ref 3.5–5.3)
SODIUM: 140 meq/L (ref 135–145)
TOTAL PROTEIN: 7.5 g/dL (ref 6.0–8.3)
Total Bilirubin: 0.4 mg/dL (ref 0.2–1.2)

## 2015-01-20 LAB — LIPID PANEL
CHOL/HDL RATIO: 3.4 ratio
Cholesterol: 209 mg/dL — ABNORMAL HIGH (ref 0–200)
HDL: 62 mg/dL (ref >=46)
LDL CALC: 131 mg/dL — AB (ref 0–99)
Triglycerides: 78 mg/dL (ref <150)
VLDL: 16 mg/dL (ref 0–40)

## 2015-01-20 NOTE — Progress Notes (Signed)
[facility] [address], [place] [address] [phone]  Date:  01/20/2015   Name:  Shannon Calderon   DOB:  11-Mar-1956   MRN:  [id number]  PCP:  No PCP Per Patient    Chief Complaint: Annual Exam   History of Present Illness:  Shannon Calderon is a 59 y.o. very pleasant female patient who presents with the following:  Here today for an annual exam History of abd hysterectomy in approx 2005; this was done for fibroids.  No history of GYN cancer She is fasting today for labs- history of HTN and hyperlipidemia  Last mammo was less than a year ago.  However she notes that her left breast may feel 'heavy" when she is lifting at work.  It will feel sore to the touch.  She has noted this for a couple of weeks.  Last colonoscopy 12/2013- normal.    She works in a Set designer.  Saw Dr. Audria Nine last month- she had x-rays of her lumbar spine.  These looked ok. She is using tramadol and mobic as needed.  However now she feels like the pain is more in her left hip.  She has pain when she lies down at night- however she is still able to do her normal activities and work without pain really  She has a hiatal hernia and uses prilosec for this   Wt Readings from Last 3 Encounters:  01/20/15 142 lb (64.411 kg)  11/28/14 146 lb 6.4 oz (66.407 kg)  07/22/14 149 lb 12.8 oz (67.949 kg)      BP Readings from Last 3 Encounters:  01/20/15 102/69  11/28/14 112/66  07/22/14 110/70    Patient Active Problem List   Diagnosis Date Noted  . HTN (hypertension) 05/08/2014  . Hyperlipidemia 05/08/2014    Past Medical History  Diagnosis Date  . GERD (gastroesophageal reflux disease)   . Hyperlipidemia   . Hypertension     Past Surgical History  Procedure Laterality Date  . Abdominal hysterectomy    . Ectopic pregnancy surgery      Had 2 ectopic pregnancies    History  Substance Use Topics  . Smoking status: Never Smoker   . Smokeless tobacco: Not on  file  . Alcohol Use: No    Family History  Problem Relation Age of Onset  . Stroke Mother   . Hypertension Mother   . Cancer Father   . HIV/AIDS Sister   . Blindness Sister     Allergies  Allergen Reactions  . Latex     Medication list has been reviewed and updated.  Current Outpatient Prescriptions on File Prior to Visit  Medication Sig Dispense Refill  . acyclovir (ZOVIRAX) 400 MG tablet Take 1 tablet (400 mg total) by mouth once. 90 tablet 1  . aspirin 81 MG tablet Take 81 mg by mouth daily.    . hydrochlorothiazide (MICROZIDE) 12.5 MG capsule Take 1 capsule (12.5 mg total) by mouth daily. 90 capsule 1  . lovastatin (MEVACOR) 20 MG tablet Take 1 tablet (20 mg total) by mouth at bedtime. 90 tablet 1  . omeprazole (PRILOSEC) 20 MG capsule Take 2 capsules by mouth daily. 180 capsule 1   No current facility-administered medications on file prior to visit.    Review of Systems:  As per HPI- otherwise negative.   Physical Examination: Filed Vitals:   01/20/15 0827  BP: 102/69  Pulse: 57  Temp: 98 F (36.7 C)  Resp:  16   Filed Vitals:   01/20/15 0827  Height: 5\' 4"  (1.626 m)  Weight: 142 lb (64.411 kg)   Body mass index is 24.36 kg/(m^2). Ideal Body Weight: Weight in (lb) to have BMI = 25: 145.3  GEN: WDWN, NAD, Non-toxic, A & O x 3 HEENT: Atraumatic, Normocephalic. Neck supple. No masses, No LAD. Ears and Nose: No external deformity. CV: RRR, No M/G/R. No JVD. No thrill. No extra heart sounds. PULM: CTA B, no wheezes, crackles, rhonchi. No retractions. No resp. distress. No accessory muscle use. ABD: S, NT, ND, +BS. No rebound. No HSM. EXTR: No c/c/e NEURO Normal gait.  PSYCH: Normally interactive. Conversant. Not depressed or anxious appearing.  Calm demeanor.  Breast: normal exam, no masses/ dimpling/ discharge.  Slight tenderness under the left breast in the left pectoralis muscle Left hip: normal ROM, no pain with flexion or rotation, no tenderness with  palpation of hip Assessment and Plan: Physical exam  Dyslipidemia - Plan: Lipid panel  Screening for diabetes mellitus - Plan: Comprehensive metabolic panel  Screening for deficiency anemia - Plan: CBC  CPE today- she is s/p total hyst for a benign cause so no pap needed Await her labs and will be in touch with her Offered to do x-rays of her left hip today- she declines as this is not covered by her CPE today Otherwise she is doing well, staying active, she does not smoke, drink alcohol or use any drugs. She is not SA currently   At this point her BP is low- will have her stop her BP medication.  She will keep an eye on her BP and let me know if it gets too high  Signed Abbe AmsterdamJessica Copland, MD

## 2015-01-20 NOTE — Patient Instructions (Signed)
Great to see you today I will be in touch with your labs I agree that you can stop taking the BP medication (hydrochlorothiazide) as your BP is a little bit low Continue cholesterol medication I think the tenderness in your left chest is due to your pectoralis muscle- however if this does not resolve in 2 weeks let me know Please come and see me at your convenience to follow-up on your left hip pain- Sooner if worse.

## 2015-02-06 ENCOUNTER — Ambulatory Visit (INDEPENDENT_AMBULATORY_CARE_PROVIDER_SITE_OTHER): Payer: 59 | Admitting: Urgent Care

## 2015-02-06 VITALS — BP 132/80 | HR 68 | Temp 98.2°F | Resp 18 | Ht 63.0 in | Wt 143.4 lb

## 2015-02-06 DIAGNOSIS — J302 Other seasonal allergic rhinitis: Secondary | ICD-10-CM | POA: Diagnosis not present

## 2015-02-06 DIAGNOSIS — L209 Atopic dermatitis, unspecified: Secondary | ICD-10-CM

## 2015-02-06 MED ORDER — CETIRIZINE HCL 10 MG PO TABS: 10.0000 mg | ORAL_TABLET | Freq: Every day | ORAL | Status: AC

## 2015-02-06 MED ORDER — TRIAMCINOLONE 0.1 % CREAM:EUCERIN CREAM 1:1: 1.0000 "application " | TOPICAL_CREAM | Freq: Two times a day (BID) | CUTANEOUS | Status: AC

## 2015-02-06 MED ORDER — FLUTICASONE PROPIONATE 50 MCG/ACT NA SUSP: 2.0000 | Freq: Every day | NASAL | Status: AC

## 2015-02-06 NOTE — Patient Instructions (Signed)
Eczema Eczema, also called atopic dermatitis, is a skin disorder that causes inflammation of the skin. It causes a red rash and dry, scaly skin. The skin becomes very itchy. Eczema is generally worse during the cooler winter months and often improves with the warmth of summer. Eczema usually starts showing signs in infancy. Some children outgrow eczema, but it may last through adulthood.  CAUSES  The exact cause of eczema is not known, but it appears to run in families. People with eczema often have a family history of eczema, allergies, asthma, or hay fever. Eczema is not contagious. Flare-ups of the condition may be caused by:   Contact with something you are sensitive or allergic to.   Stress. SIGNS AND SYMPTOMS  Dry, scaly skin.   Red, itchy rash.   Itchiness. This may occur before the skin rash and may be very intense.  DIAGNOSIS  The diagnosis of eczema is usually made based on symptoms and medical history. TREATMENT  Eczema cannot be cured, but symptoms usually can be controlled with treatment and other strategies. A treatment plan might include:  Controlling the itching and scratching.   Use over-the-counter antihistamines as directed for itching. This is especially useful at night when the itching tends to be worse.   Use over-the-counter steroid creams as directed for itching.   Avoid scratching. Scratching makes the rash and itching worse. It may also result in a skin infection (impetigo) due to a break in the skin caused by scratching.   Keeping the skin well moisturized with creams every day. This will seal in moisture and help prevent dryness. Lotions that contain alcohol and water should be avoided because they can dry the skin.   Limiting exposure to things that you are sensitive or allergic to (allergens).   Recognizing situations that cause stress.   Developing a plan to manage stress.  HOME CARE INSTRUCTIONS   Only take over-the-counter or  prescription medicines as directed by your health care provider.   Do not use anything on the skin without checking with your health care provider.   Keep baths or showers short (5 minutes) in warm (not hot) water. Use mild cleansers for bathing. These should be unscented. You may add nonperfumed bath oil to the bath water. It is best to avoid soap and bubble bath.   Immediately after a bath or shower, when the skin is still damp, apply a moisturizing ointment to the entire body. This ointment should be a petroleum ointment. This will seal in moisture and help prevent dryness. The thicker the ointment, the better. These should be unscented.   Keep fingernails cut short. Children with eczema may need to wear soft gloves or mittens at night after applying an ointment.   Dress in clothes made of cotton or cotton blends. Dress lightly, because heat increases itching.   A child with eczema should stay away from anyone with fever blisters or cold sores. The virus that causes fever blisters (herpes simplex) can cause a serious skin infection in children with eczema. SEEK MEDICAL CARE IF:   Your itching interferes with sleep.   Your rash gets worse or is not better within 1 week after starting treatment.   You see pus or soft yellow scabs in the rash area.   You have a fever.   You have a rash flare-up after contact with someone who has fever blisters.  Document Released: 07/09/2000 Document Revised: 05/02/2013 Document Reviewed: 02/12/2013 [facility] Patient Information 2015 [place], [place]. This information  is not intended to replace advice given to you by your health care provider. Make sure you discuss any questions you have with your health care provider. ° °Allergies °Allergies may happen from anything your body is sensitive to. This may be food, medicines, pollens, chemicals, and nearly anything around you in everyday life that produces allergens. An allergen is anything that causes  an allergy producing substance. Heredity is often a factor in causing these problems. This means you may have some of the same allergies as your parents. °Food allergies happen in all age groups. Food allergies are some of the most severe and life threatening. Some common food allergies are cow's milk, seafood, eggs, nuts, wheat, and soybeans. °SYMPTOMS  °· Swelling around the mouth. °· An itchy red rash or hives. °· Vomiting or diarrhea. °· Difficulty breathing. °SEVERE ALLERGIC REACTIONS ARE LIFE-THREATENING. °This reaction is called anaphylaxis. It can cause the mouth and throat to swell and cause difficulty with breathing and swallowing. In severe reactions only a trace amount of food (for example, peanut oil in a salad) may cause death within seconds. °Seasonal allergies occur in all age groups. These are seasonal because they usually occur during the same season every year. They may be a reaction to molds, grass pollens, or tree pollens. Other causes of problems are house dust mite allergens, pet dander, and mold spores. The symptoms often consist of nasal congestion, a runny itchy nose associated with sneezing, and tearing itchy eyes. There is often an associated itching of the mouth and ears. The problems happen when you come in contact with pollens and other allergens. Allergens are the particles in the air that the body reacts to with an allergic reaction. This causes you to release allergic antibodies. Through a chain of events, these eventually cause you to release histamine into the blood stream. Although it is meant to be protective to the body, it is this release that causes your discomfort. This is why you were given anti-histamines to feel better.  If you are unable to pinpoint the offending allergen, it may be determined by skin or blood testing. Allergies cannot be cured but can be controlled with medicine. °Hay fever is a collection of all or some of the seasonal allergy problems. It may often be  treated with simple over-the-counter medicine such as diphenhydramine. Take medicine as directed. Do not drink alcohol or drive while taking this medicine. Check with your caregiver or package insert for child dosages. °If these medicines are not effective, there are many new medicines your caregiver can prescribe. Stronger medicine such as nasal spray, eye drops, and corticosteroids may be used if the first things you try do not work well. Other treatments such as immunotherapy or desensitizing injections can be used if all else fails. Follow up with your caregiver if problems continue. These seasonal allergies are usually not life threatening. They are generally more of a nuisance that can often be handled using medicine. °HOME CARE INSTRUCTIONS  °· If unsure what causes a reaction, keep a diary of foods eaten and symptoms that follow. Avoid foods that cause reactions. °· If hives or rash are present: °¨ Take medicine as directed. °¨ You may use an over-the-counter antihistamine (diphenhydramine) for hives and itching as needed. °¨ Apply cold compresses (cloths) to the skin or take baths in cool water. Avoid hot baths or showers. Heat will make a rash and itching worse. °· If you are severely allergic: °¨ Following a treatment for a severe reaction, hospitalization   is often required for closer follow-up. °¨ Wear a medic-alert bracelet or necklace stating the allergy. °¨ You and your family must learn how to give adrenaline or use an anaphylaxis kit. °¨ If you have had a severe reaction, always carry your anaphylaxis kit or EpiPen® with you. Use this medicine as directed by your caregiver if a severe reaction is occurring. Failure to do so could have a fatal outcome. °SEEK MEDICAL CARE IF: °· You suspect a food allergy. Symptoms generally happen within 30 minutes of eating a food. °· Your symptoms have not gone away within 2 days or are getting worse. °· You develop new symptoms. °· You want to retest yourself or  your child with a food or drink you think causes an allergic reaction. Never do this if an anaphylactic reaction to that food or drink has happened before. Only do this under the care of a caregiver. °SEEK IMMEDIATE MEDICAL CARE IF:  °· You have difficulty breathing, are wheezing, or have a tight feeling in your chest or throat. °· You have a swollen mouth, or you have hives, swelling, or itching all over your body. °· You have had a severe reaction that has responded to your anaphylaxis kit or an EpiPen®. These reactions may return when the medicine has worn off. These reactions should be considered life threatening. °MAKE SURE YOU:  °· Understand these instructions. °· Will watch your condition. °· Will get help right away if you are not doing well or get worse. °Document Released: 10/05/2002 Document Revised: 11/06/2012 Document Reviewed: 03/11/2008 °[employer]® Patient Information ©2015 [employer]. This information is not intended to replace advice given to you by your health care provider. Make sure you discuss any questions you have with your health care provider. ° °

## 2015-02-06 NOTE — Progress Notes (Signed)
    MRN: [id number] DOB: 04/29/1956  Subjective:   Shannon Calderon A Shannon Calderon is a 59 y.o. female presenting for chief complaint of Rash  Reports 2 month history of intermittent rash over her neck, rash is itchy and dry. Has tried otc cortisone cream with significant relief but no resolution of symptoms. She is now having same problem over the fold of her left elbow. Patient also reports nasal congestion, ear popping, itchy ears, scratchy throat. Denies fever, redness, pain, numbness and tingling, drainage of pus or bleeding. Also denies sinus pain, itchy or red eyes, cough, shob, wheezing. Denies history of asthma. Denies any other aggravating or relieving factors, no other questions or concerns.  Shannon Calderon has a current medication list which includes the following prescription(s): acyclovir, aspirin, lovastatin, and omeprazole. She is allergic to latex.  Shannon Calderon  has a past medical history of GERD (gastroesophageal reflux disease); Hyperlipidemia; and Hypertension. Also  has past surgical history that includes Abdominal hysterectomy and Ectopic pregnancy surgery.  ROS As in subjective.  Objective:   Vitals: BP 132/80 mmHg  Pulse 68  Temp(Src) 98.2 F (36.8 C) (Oral)  Resp 18  Ht 5\' 3"  (1.6 m)  Wt 143 lb 6.4 oz (65.046 kg)  BMI 25.41 kg/m2  SpO2 97%  Physical Exam  Constitutional: She appears well-developed and well-nourished.  Neck:    Cardiovascular: Normal rate.   Pulmonary/Chest: Effort normal.  Skin: Skin is warm and dry. Rash noted. No erythema. No pallor.   Assessment and Plan :   1. Seasonal allergies - Start Zyrtec and Flonase for seasonal allergies.  2. Atopic dermatitis - Apply thin layer of triamcinolone eucerin mix to neck and elbow.  - RTC in 1 week if no improvement, consider acanthosis nigracans.  Shannon BambergMario Kristofer Schaffert, PA-C [facility] [phone] 02/06/2015 10:19 AM

## 2015-02-26 ENCOUNTER — Ambulatory Visit (INDEPENDENT_AMBULATORY_CARE_PROVIDER_SITE_OTHER): Payer: 59 | Admitting: Emergency Medicine

## 2015-02-26 ENCOUNTER — Ambulatory Visit (INDEPENDENT_AMBULATORY_CARE_PROVIDER_SITE_OTHER): Payer: 59

## 2015-02-26 VITALS — BP 138/78 | HR 60 | Temp 97.8°F | Resp 17 | Ht 63.0 in | Wt 145.0 lb

## 2015-02-26 DIAGNOSIS — M5442 Lumbago with sciatica, left side: Secondary | ICD-10-CM

## 2015-02-26 DIAGNOSIS — M1990 Unspecified osteoarthritis, unspecified site: Secondary | ICD-10-CM | POA: Diagnosis not present

## 2015-02-26 DIAGNOSIS — M25552 Pain in left hip: Secondary | ICD-10-CM

## 2015-02-26 DIAGNOSIS — M1612 Unilateral primary osteoarthritis, left hip: Secondary | ICD-10-CM

## 2015-02-26 MED ORDER — MELOXICAM 15 MG PO TABS: 15.0000 mg | ORAL_TABLET | Freq: Every day | ORAL | Status: AC

## 2015-02-26 NOTE — Progress Notes (Signed)
   Subjective:    Patient ID: Shannon Calderon, female    DOB: [birth year], 59 y.o.   MRN: [id number]  HPI Patient presents for left sided hip pain that has been present for the past 4-5 months. Pain is usually 5/10, but is worse at night and is 10/10 pain. Pain radiates to thigh and sometimes experiences numbness in toes. H/o lower back pain with sciatica that she is only taking tramadol for and states she was never given any anti-inflammatories for condition. Denies loss of fxn/ROM/sensation, gait change, swelling, or weakness. Denies h/o surgeries or procedures to leg or hip. No h/o DM, DVT, PE. H/o HTN that is controlled without medication.  Med allergy: Latex.   Review of Systems  Constitutional: Negative.   Musculoskeletal: Positive for myalgias (baseline), back pain (baseline) and arthralgias. Negative for joint swelling and gait problem.       Objective:   Physical Exam  Constitutional: She is oriented to person, place, and time. She appears well-developed and well-nourished. No distress.  Blood pressure 138/78, pulse 60, temperature 97.8 F (36.6 C), temperature source Oral, resp. rate 17, height  (1.6 m), weight 145 lb (65.772 kg), SpO2 98 %.  HENT:  Head: Normocephalic and atraumatic.  Right Ear: External ear normal.  Left Ear: External ear normal.  Eyes: Conjunctivae are normal. Right eye exhibits no discharge. Left eye exhibits no discharge. No scleral icterus.  Pulmonary/Chest: Effort normal.  Musculoskeletal:       Left hip: She exhibits tenderness. She exhibits normal range of motion, normal strength, no bony tenderness, no swelling, no crepitus, no deformity and no laceration.       Thoracic back: Normal.       Lumbar back: She exhibits pain (baseline). She exhibits normal range of motion, no tenderness, no bony tenderness, no swelling, no edema, no deformity and no laceration.       Left upper leg: She exhibits no tenderness, no bony tenderness, no swelling, no edema,  no deformity and no laceration.  Neurological: She is alert and oriented to person, place, and time.  Skin: She is not diaphoretic.  Psychiatric: She has a normal mood and affect. Her behavior is normal. Judgment and thought content normal.   [facility] reading (PRIMARY) by  Dr. Cleta Alberts. Mild degenerative changes of left hip.     Assessment & Plan:  1. Arthritis of left hip 2. Hip pain, left May continue tramadol prn. - DG HIP UNILAT W OR W/O PELVIS 2-3 VIEWS LEFT; Future - meloxicam (MOBIC) 15 MG tablet; Take 1 tablet (15 mg total) by mouth daily.  Dispense: 30 tablet; Refill: 1 - AMB referral to orthopedics   3. Midline low back pain with left-sided sciatica - AMB referral to orthopedics   Janan Ridge PA-C  [facility] 02/26/2015 10:22 AM

## 2015-02-26 NOTE — Patient Instructions (Signed)
Arthritis, Nonspecific °Arthritis is pain, redness, warmth, or puffiness (inflammation) of a joint. The joint may be stiff or hurt when you move it. One or more joints may be affected. There are many types of arthritis. Your doctor may not know what type you have right away. The most common cause of arthritis is wear and tear on the joint (osteoarthritis). °HOME CARE  °· Only take medicine as told by your doctor. °· Rest the joint as much as possible. °· Raise (elevate) your joint if it is puffy. °· Use crutches if the painful joint is in your leg. °· Drink enough fluids to keep your pee (urine) clear or pale yellow. °· Follow your doctor's diet instructions. °· Use cold packs for very bad joint pain for 10 to 15 minutes every hour. Ask your doctor if it is okay for you to use hot packs. °· Exercise as told by your doctor. °· Take a warm shower if you have stiffness in the morning. °· Move your sore joints throughout the day. °GET HELP RIGHT AWAY IF:  °· You have a fever. °· You have very bad joint pain, puffiness, or redness. °· You have many joints that are painful and puffy. °· You are not getting better with treatment. °· You have very bad back pain or leg weakness. °· You cannot control when you poop (bowel movement) or pee (urinate). °· You do not feel better in 24 hours or are getting worse. °· You are having side effects from your medicine. °MAKE SURE YOU:  °· Understand these instructions. °· Will watch your condition. °· Will get help right away if you are not doing well or get worse. °Document Released: 10/06/2009 Document Revised: 01/11/2012 Document Reviewed: 10/06/2009 °[employer]® Patient Information ©2015 [employer]. This information is not intended to replace advice given to you by your health care provider. Make sure you discuss any questions you have with your health care provider. ° °

## 2015-03-28 ENCOUNTER — Encounter: Payer: 59 | Admitting: Family Medicine

## 2015-11-29 IMAGING — US US PELVIS COMPLETE
1 series · 14 of 25 positions shown · non-contrast
Comparison: None

CLINICAL DATA: Right lower quadrant pain x3 weeks, status post
hysterectomy

EXAM:
TRANSABDOMINAL AND TRANSVAGINAL ULTRASOUND OF PELVIS
TECHNIQUE: Both transabdominal and transvaginal ultrasound examinations of the
pelvis were performed. Transabdominal technique was performed for
global imaging of the pelvis including uterus, ovaries, adnexal
regions, and pelvic cul-de-sac. It was necessary to proceed with
endovaginal exam following the transabdominal exam to visualize the
right ovary and left adnexa.

[Series 1: us transvaginal non-ob · 14 of 27 slices shown]
[im 1/27]
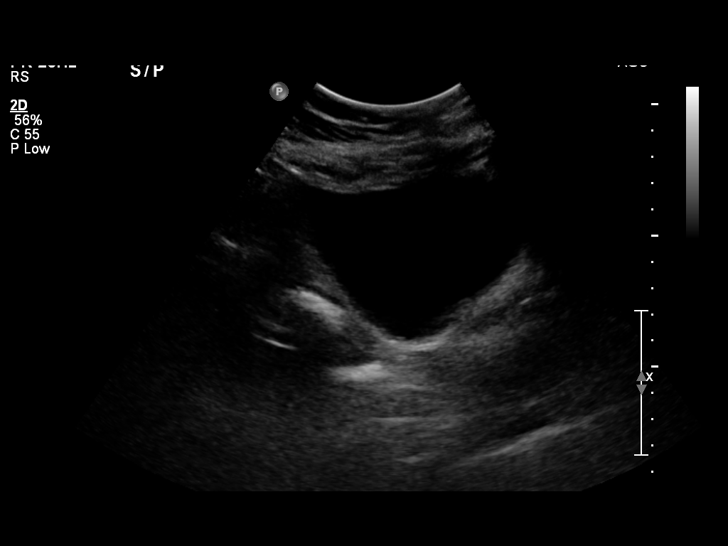
[im 3/27]
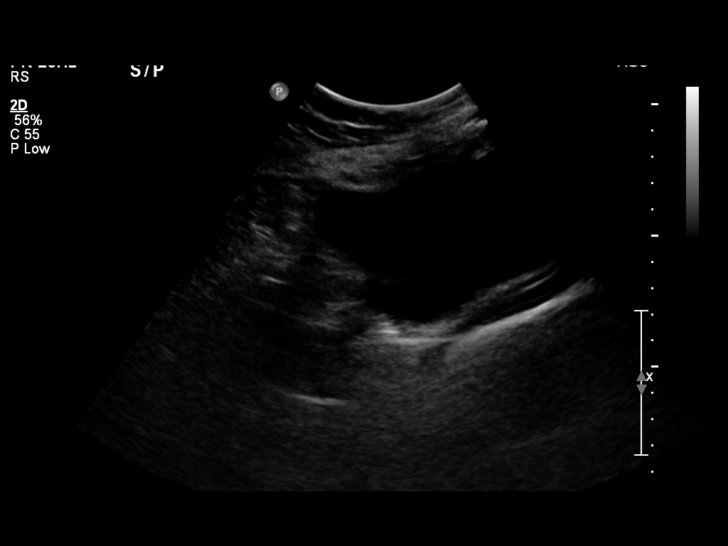
[im 5/27]
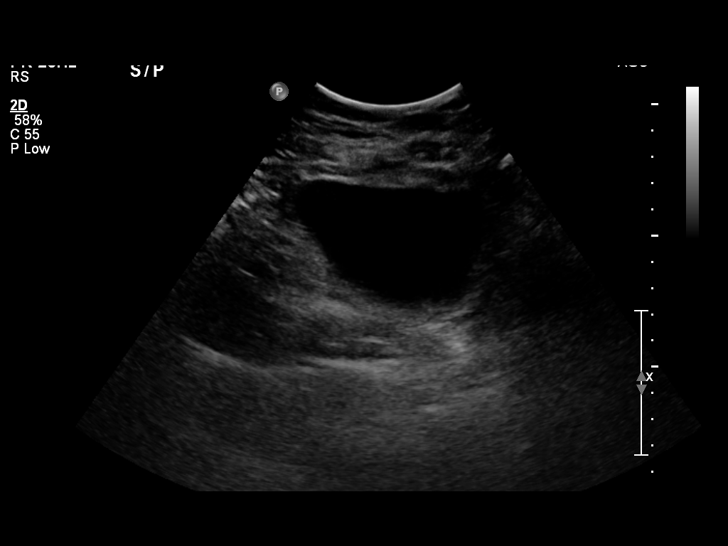
[im 7/27]
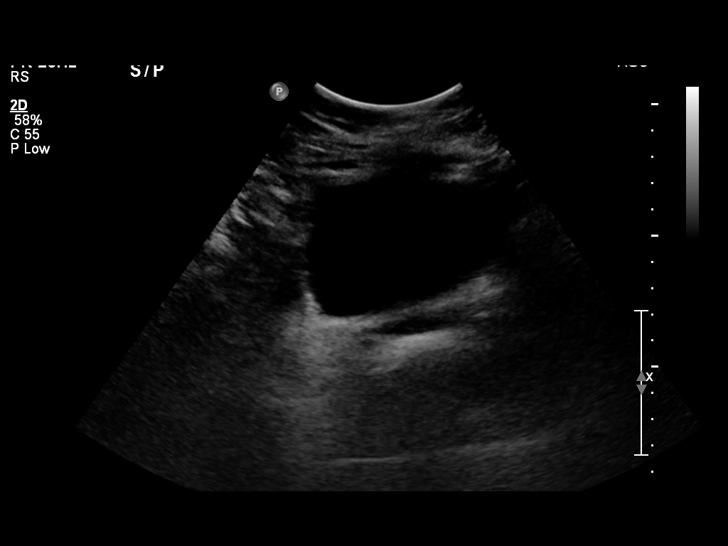
[im 9/27]
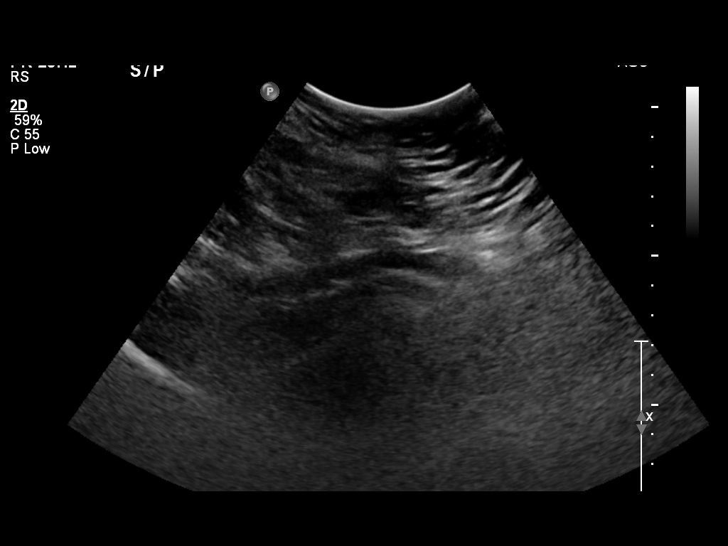
[im 10/27]
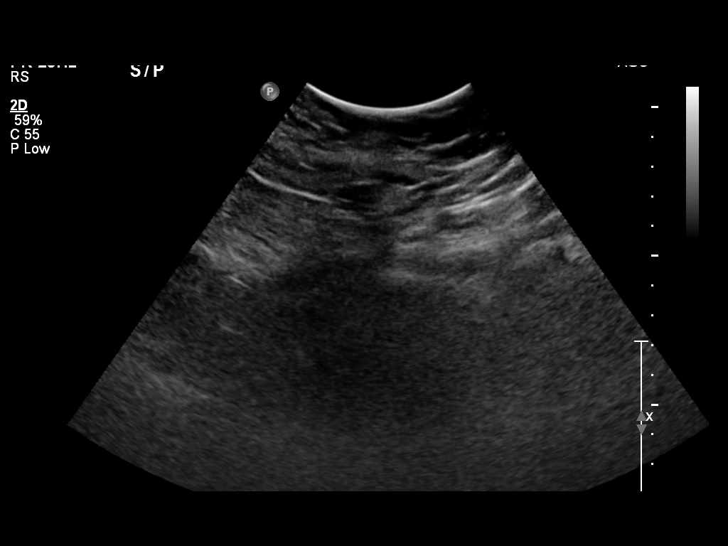
[im 12/27]
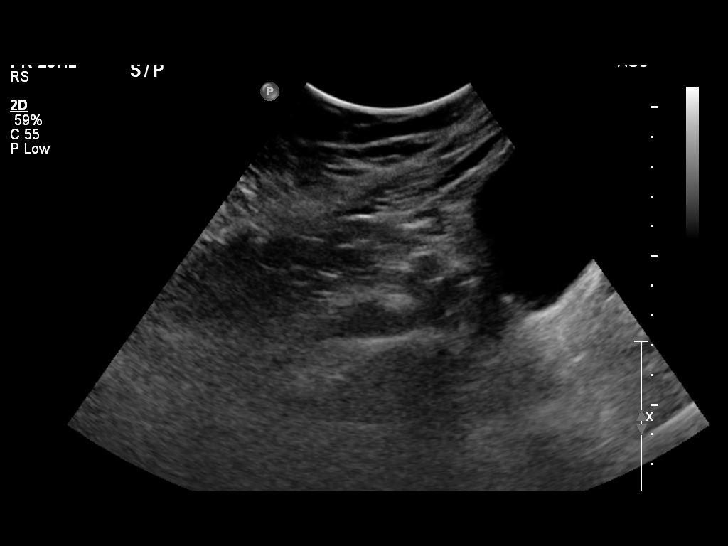
[im 15/27]
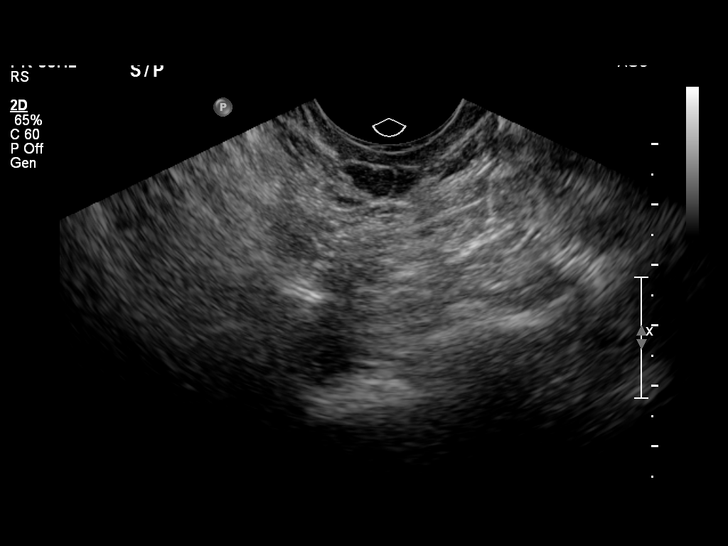
[im 17/27]
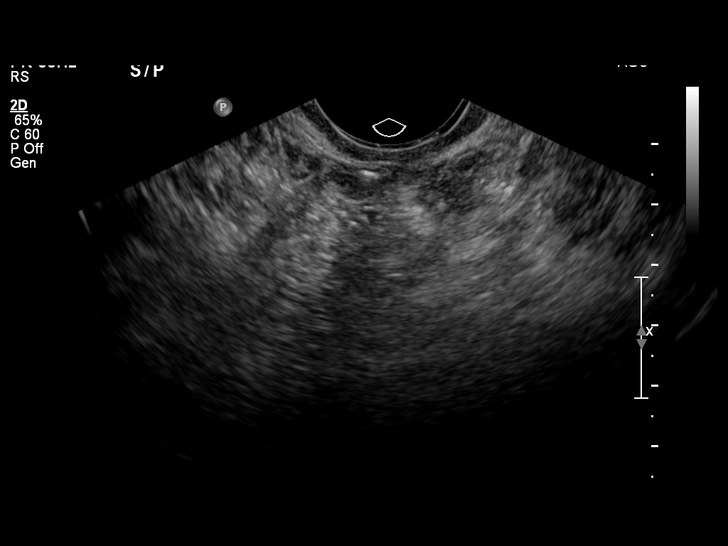
[im 18/27]
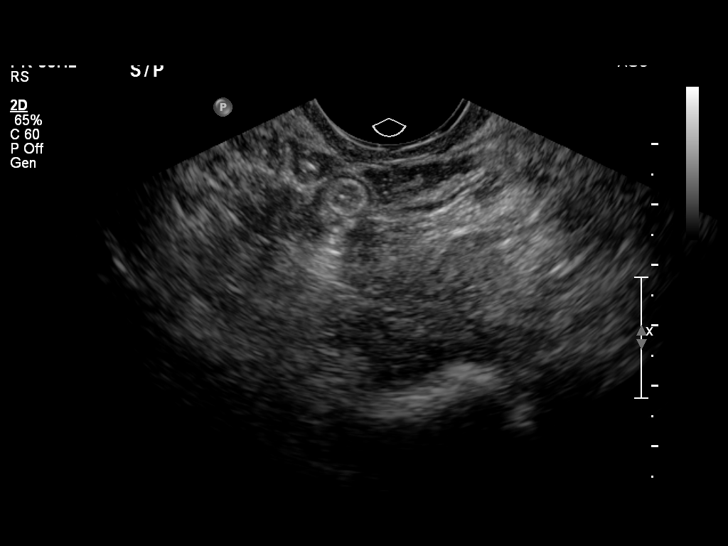
[im 20/27]
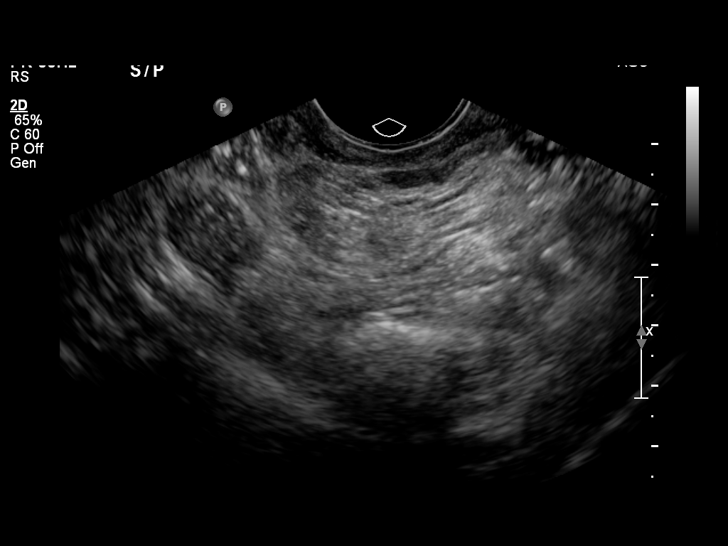
[im 22/27]
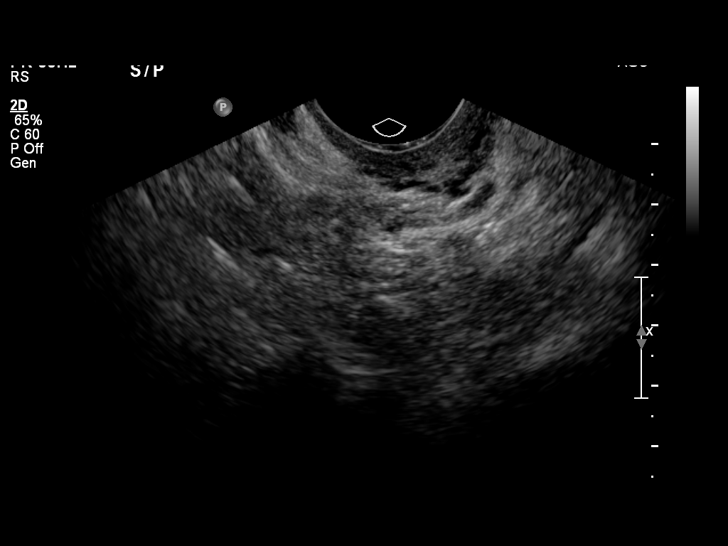
[im 24/27]
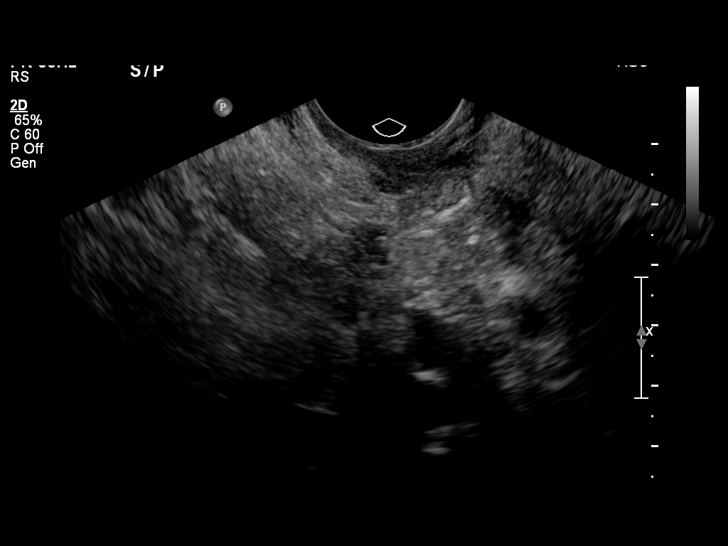
[im 27/27]
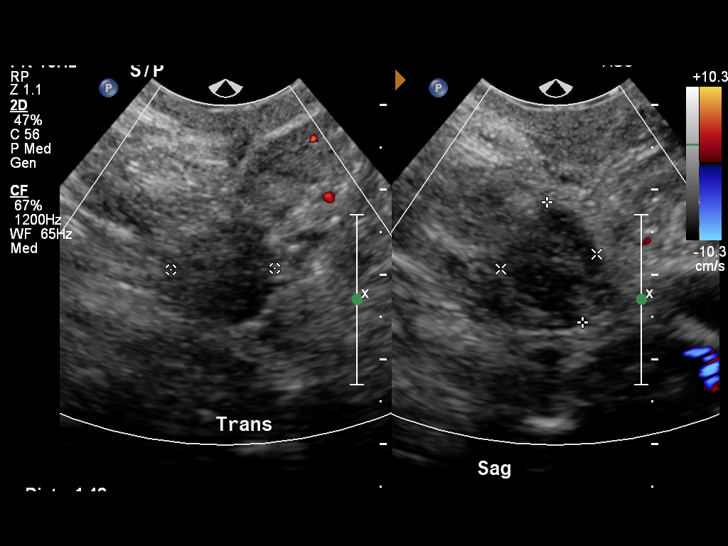

[14 of 25 positions shown; findings below may reference images not displayed]

FINDINGS: Uterus

Surgically absent.

Right ovary

Measurements: 1.5 x 1.2 x 1.2 cm. Normal appearance/no adnexal mass.

Left ovary

Not visualized, possibly surgically absent.

Other findings

No free fluid.
IMPRESSION: Status post hysterectomy.

Right ovary is within normal limits.

Left ovary is not discretely visualized, possibly surgically absent.

## 2016-07-11 IMAGING — CR DG HIP (WITH OR WITHOUT PELVIS) 2-3V*L*
3 series · 3 of 3 positions shown · non-contrast
Comparison: None.

CLINICAL DATA: Left hip pain pain of unknown origin.

EXAM:
DG HIP (WITH OR WITHOUT PELVIS) 2-3V LEFT

[AP (1 of 2)]
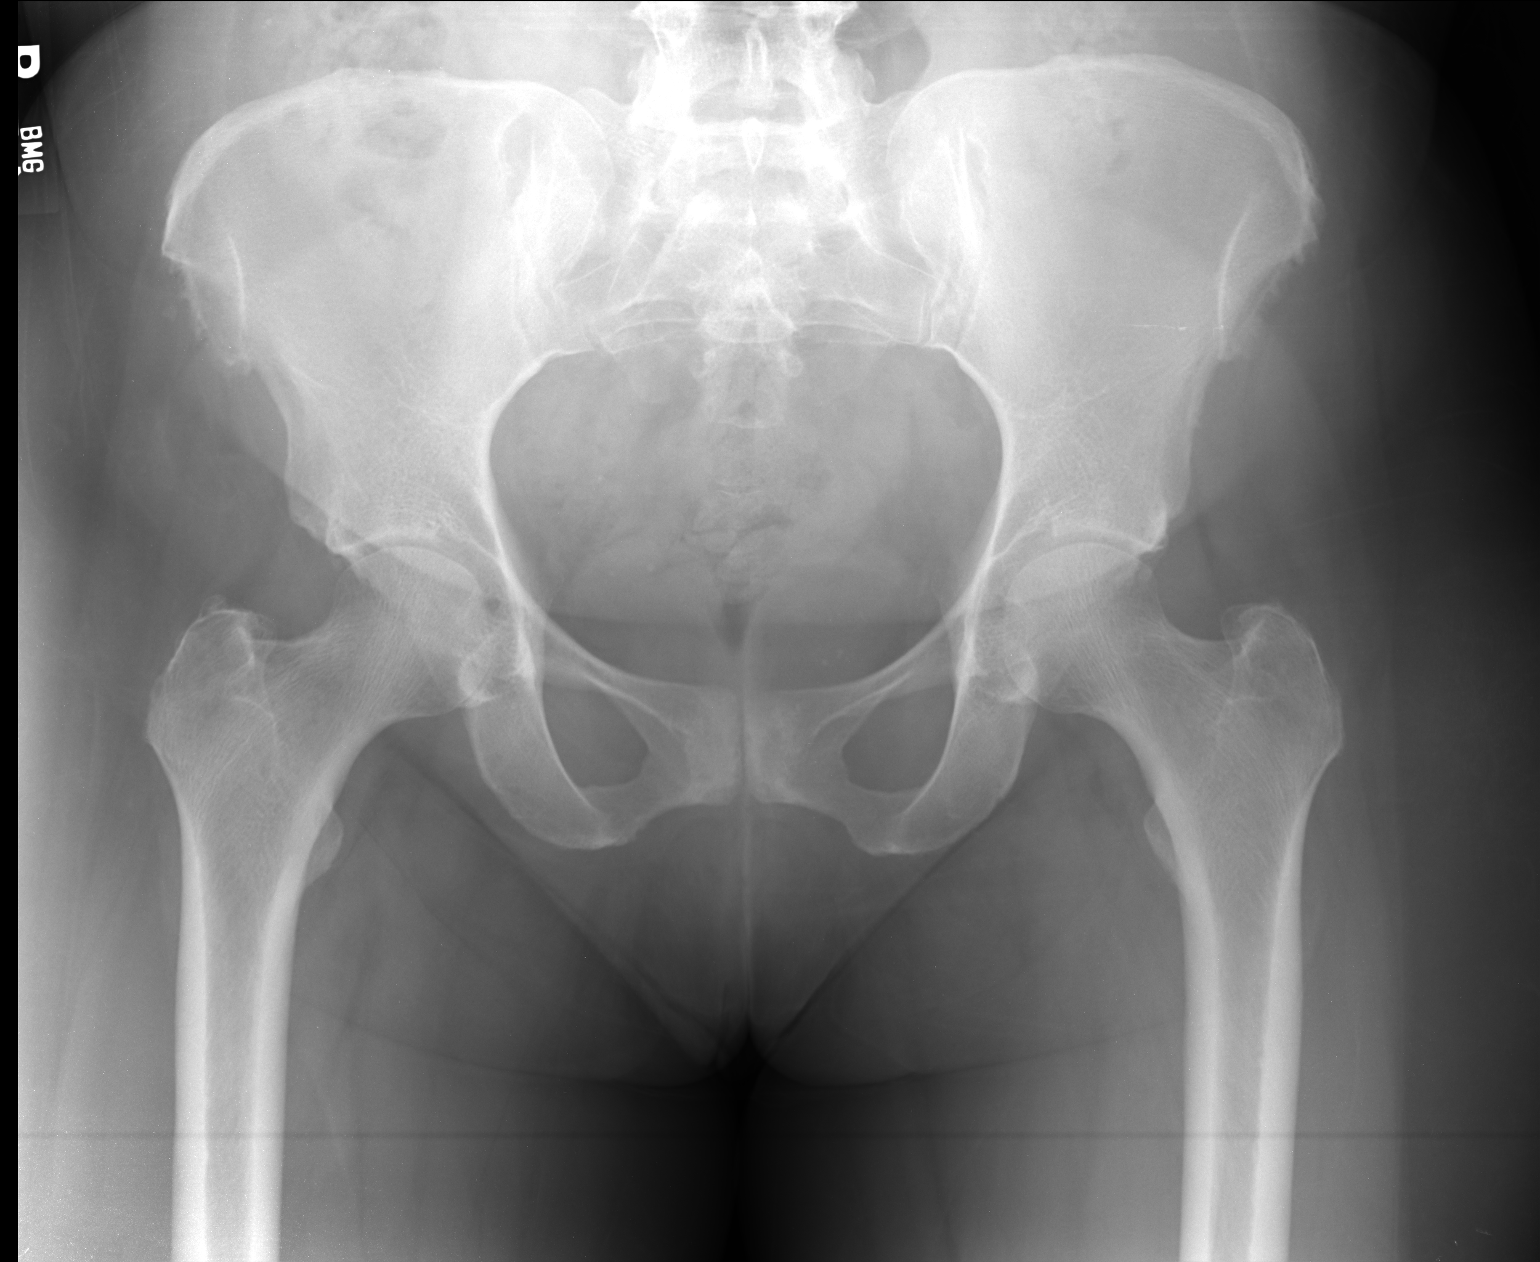

[AP (2 of 2)]
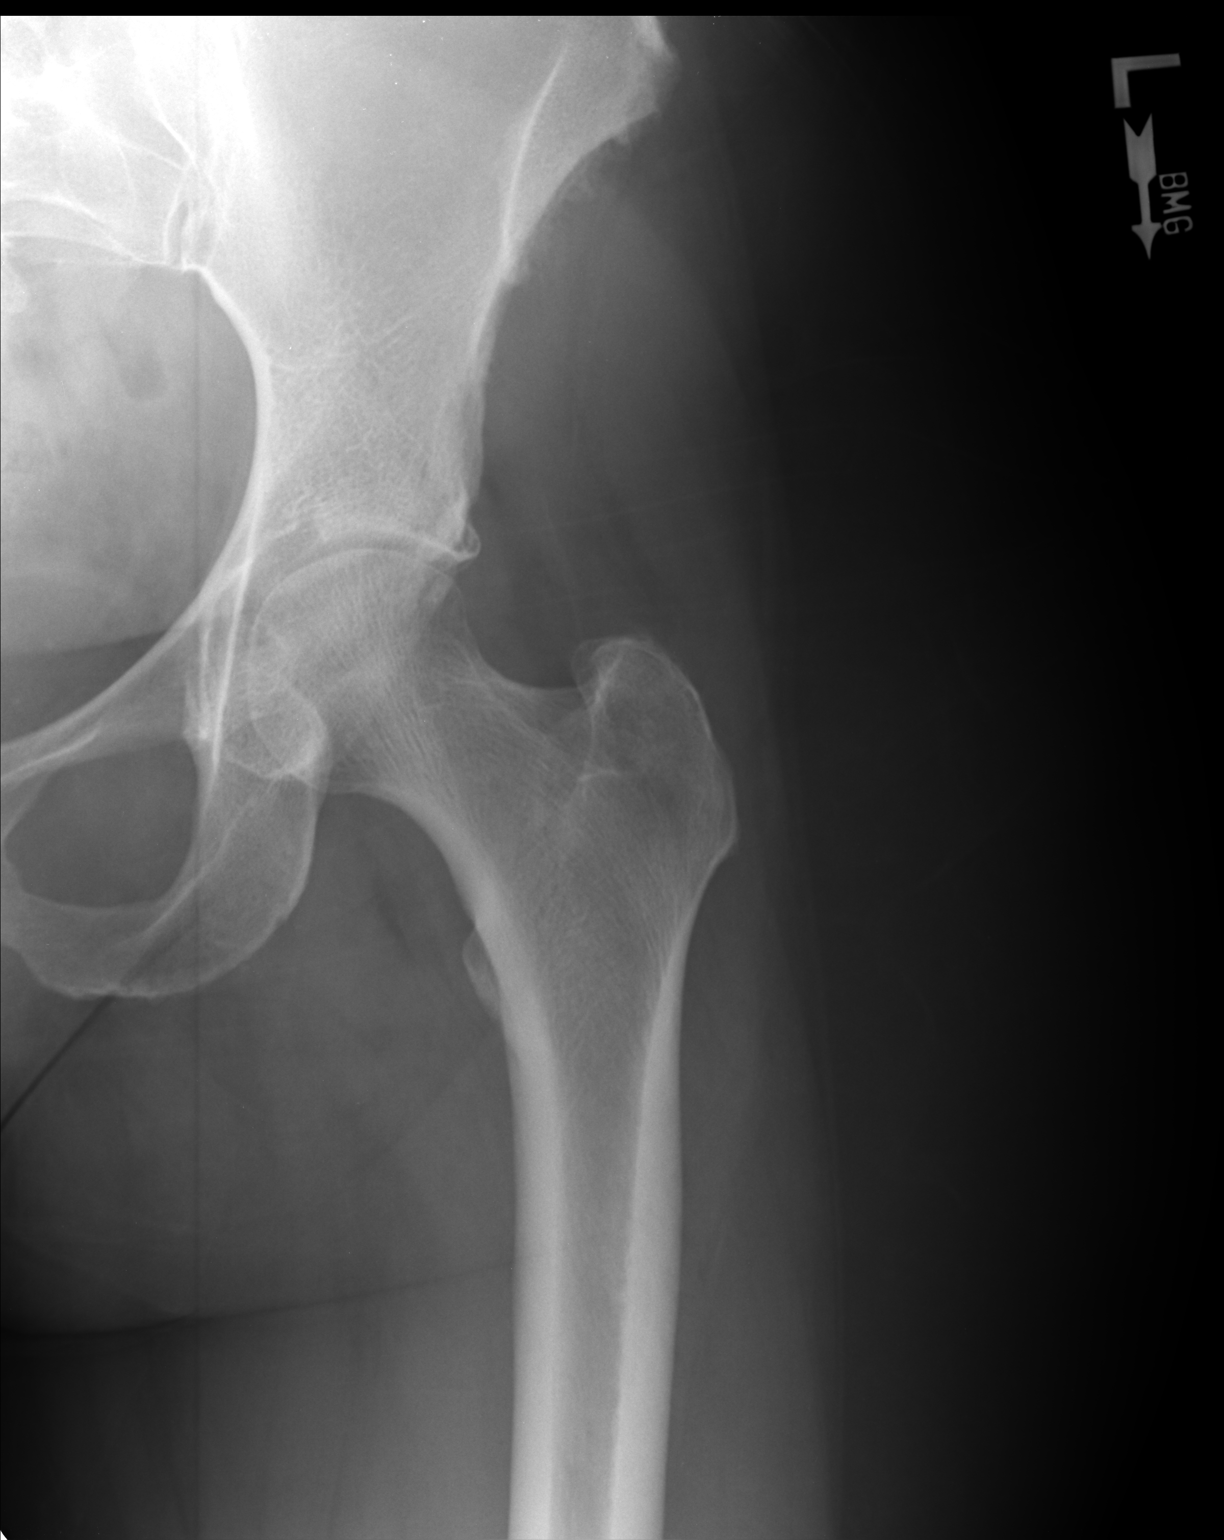

[lateral]
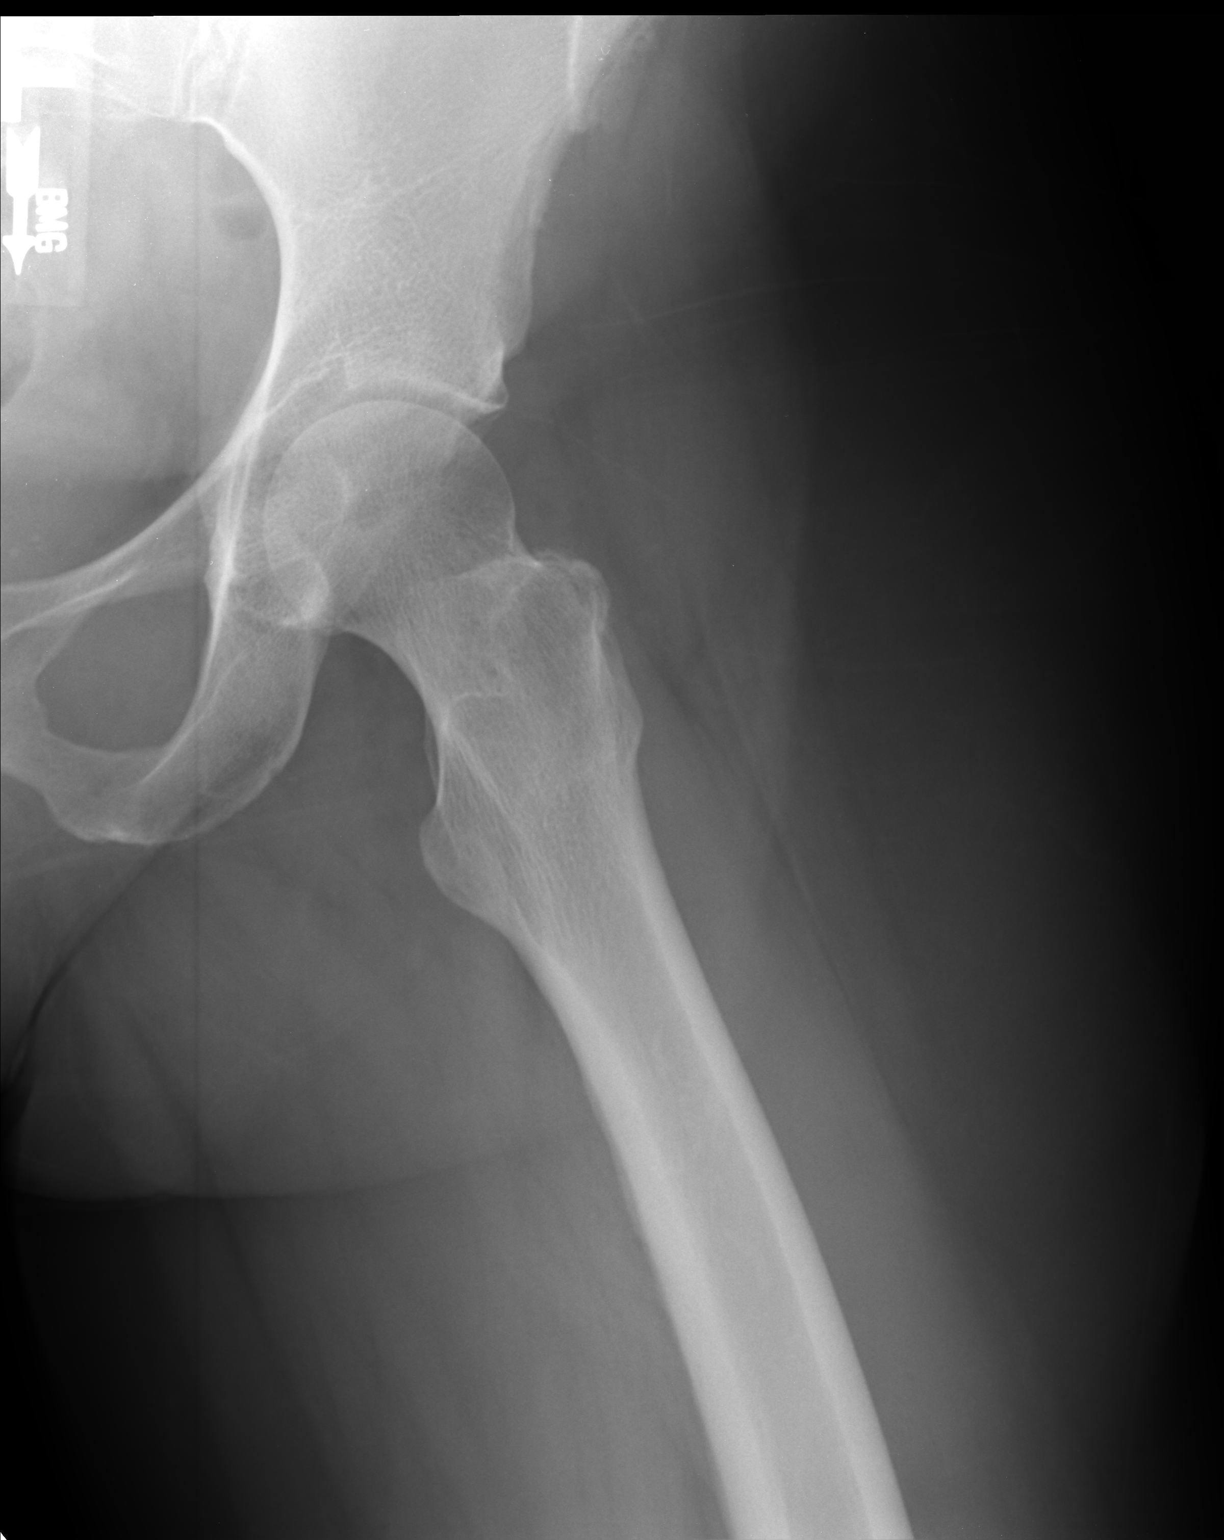

[3 of 3 positions shown; findings below may reference images not displayed]

FINDINGS: No acute bony or joint abnormality identified. Mild degenerative
changes both hips. No evidence of fracture or dislocation. Tiny
lucency in the left femoral neck, most likely a simple cyst. MRI of
the left hip can be obtained to further evaluate .
IMPRESSION: 1. Mild degenerative changes both hips.

2. Tiny lucency in the left femoral neck, most likely a small cyst.
MRI of the left hip can be obtained to further evaluate.
